# Patient Record
Sex: Female | Born: 1985 | Race: Black or African American | Hispanic: No | Marital: Married | Smoking: Never smoker
Health system: Southern US, Community
[De-identification: ages and names within clinical notes are randomized; demographics above are authoritative.]

## PROBLEM LIST (undated history)

## (undated) DIAGNOSIS — Z789 Other specified health status: Secondary | ICD-10-CM

## (undated) HISTORY — PX: NO PAST SURGERIES: SHX2092

---

## 2015-02-23 ENCOUNTER — Other Ambulatory Visit (HOSPITAL_COMMUNITY)
Admission: RE | Admit: 2015-02-23 | Discharge: 2015-02-23 | Disposition: A | Discharge status: Home / Self Care | Payer: 59 | Source: Ambulatory Visit | Attending: Obstetrics & Gynecology | Admitting: Obstetrics & Gynecology

## 2015-02-23 ENCOUNTER — Other Ambulatory Visit: Payer: Self-pay | Admitting: Obstetrics & Gynecology

## 2015-02-23 DIAGNOSIS — Z01411 Encounter for gynecological examination (general) (routine) with abnormal findings: Secondary | ICD-10-CM | POA: Diagnosis not present

## 2015-02-23 DIAGNOSIS — Z1151 Encounter for screening for human papillomavirus (HPV): Secondary | ICD-10-CM | POA: Diagnosis not present

## 2015-02-23 DIAGNOSIS — Z8742 Personal history of other diseases of the female genital tract: Secondary | ICD-10-CM | POA: Diagnosis not present

## 2015-02-23 DIAGNOSIS — Z01419 Encounter for gynecological examination (general) (routine) without abnormal findings: Secondary | ICD-10-CM | POA: Insufficient documentation

## 2015-02-23 DIAGNOSIS — Z3009 Encounter for other general counseling and advice on contraception: Secondary | ICD-10-CM | POA: Diagnosis not present

## 2015-02-23 DIAGNOSIS — Z3046 Encounter for surveillance of implantable subdermal contraceptive: Secondary | ICD-10-CM | POA: Diagnosis not present

## 2015-02-27 LAB — CYTOLOGY - PAP

## 2015-06-29 DIAGNOSIS — Z3400 Encounter for supervision of normal first pregnancy, unspecified trimester: Secondary | ICD-10-CM | POA: Diagnosis not present

## 2015-07-13 DIAGNOSIS — Z3402 Encounter for supervision of normal first pregnancy, second trimester: Secondary | ICD-10-CM | POA: Diagnosis not present

## 2015-08-24 DIAGNOSIS — N760 Acute vaginitis: Secondary | ICD-10-CM | POA: Diagnosis not present

## 2015-08-24 DIAGNOSIS — Z3402 Encounter for supervision of normal first pregnancy, second trimester: Secondary | ICD-10-CM | POA: Diagnosis not present

## 2015-08-24 MED FILL — TERCONAZOLE 0.4% VAG CREAM: 0.4 | 7 days supply | Qty: 45 | Fill #0

## 2015-09-06 DIAGNOSIS — Z36 Encounter for antenatal screening of mother: Secondary | ICD-10-CM | POA: Diagnosis not present

## 2015-09-06 DIAGNOSIS — Z3A20 20 weeks gestation of pregnancy: Secondary | ICD-10-CM | POA: Diagnosis not present

## 2015-09-06 DIAGNOSIS — Z3402 Encounter for supervision of normal first pregnancy, second trimester: Secondary | ICD-10-CM | POA: Diagnosis not present

## 2015-09-07 ENCOUNTER — Other Ambulatory Visit (HOSPITAL_COMMUNITY): Payer: Self-pay | Admitting: Obstetrics & Gynecology

## 2015-09-07 DIAGNOSIS — R9389 Abnormal findings on diagnostic imaging of other specified body structures: Secondary | ICD-10-CM

## 2015-09-07 DIAGNOSIS — Z3689 Encounter for other specified antenatal screening: Secondary | ICD-10-CM

## 2015-09-07 DIAGNOSIS — Z3A21 21 weeks gestation of pregnancy: Secondary | ICD-10-CM

## 2015-09-11 ENCOUNTER — Encounter (HOSPITAL_COMMUNITY): Payer: Self-pay | Admitting: *Deleted

## 2015-09-12 ENCOUNTER — Encounter (HOSPITAL_COMMUNITY): Payer: Self-pay

## 2015-09-12 ENCOUNTER — Ambulatory Visit (HOSPITAL_COMMUNITY)
Admission: RE | Admit: 2015-09-12 | Discharge: 2015-09-12 | Disposition: A | Discharge status: Home / Self Care | Payer: 59 | Source: Ambulatory Visit | Attending: Obstetrics & Gynecology | Admitting: Obstetrics & Gynecology

## 2015-09-12 VITALS — BP 123/75 | HR 91 | Wt 180.6 lb

## 2015-09-12 DIAGNOSIS — Z36 Encounter for antenatal screening of mother: Secondary | ICD-10-CM | POA: Insufficient documentation

## 2015-09-12 DIAGNOSIS — R9389 Abnormal findings on diagnostic imaging of other specified body structures: Secondary | ICD-10-CM

## 2015-09-12 DIAGNOSIS — IMO0002 Reserved for concepts with insufficient information to code with codable children: Secondary | ICD-10-CM

## 2015-09-12 DIAGNOSIS — Z3A21 21 weeks gestation of pregnancy: Secondary | ICD-10-CM | POA: Insufficient documentation

## 2015-09-12 DIAGNOSIS — O358XX0 Maternal care for other (suspected) fetal abnormality and damage, not applicable or unspecified: Secondary | ICD-10-CM

## 2015-09-12 DIAGNOSIS — O284 Abnormal radiological finding on antenatal screening of mother: Secondary | ICD-10-CM | POA: Diagnosis not present

## 2015-09-12 DIAGNOSIS — O35BXX0 Maternal care for other (suspected) fetal abnormality and damage, fetal cardiac anomalies, not applicable or unspecified: Secondary | ICD-10-CM

## 2015-09-12 DIAGNOSIS — Z3689 Encounter for other specified antenatal screening: Secondary | ICD-10-CM

## 2015-09-12 HISTORY — DX: Other specified health status: Z78.9

## 2015-09-12 NOTE — Consult Note (Signed)
MFM consult  30 yr old G1P0 at 6176w0d with concern for fetal cardiac defect on outside ultrasound referred by Dr. Charlotta Newtonzan for fetal anatomic survey and consult.  Ultrasound today shows: single intrauterine pregnancy. Fetal biometry is consistent with dating. Posterior placenta without evidence of previa. Normal amniotic fluid volume. Normal transabdominal cervical length. The fetal heart is abnormal- the left ventricle and atrium appear extremely small; the aorta appears small. The outflow tracts and arches are not well visualized. The remainder of the fetal anatomic survey is normal.  I counseled the patient as follows: 1. Appropriate fetal growth. 2. 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 I discussed that the next step is for referral to Pediatric Cardiologists. I discussed the counseling regarding treatment and prognosis will depend on specific diagnosis determined by their echocardiogram. I discussed she would likely need [facility] depending on final diagnosis to have immediate access to the Pediatric Cardiologists. The infant would likely be admitted to the NICU for care. I discussed the neonate will likely need intervention after delivery including possible intubation and surgery.  However, the Pediatric Cardiologist will discuss specific therapies and prognosis with the patient.  I discussed the strong association with congenital heart defects and fetal aneuploidy. The patient had a low risk quad screen. The risk of aneuploidy or chromosomal defect may be as high as 40%. I discussed congenital heart defects can be seen in DiGeorge syndrome and many other genetic conditions.  I discussed the benefits/risks of amniocentesis including risks of bleeding, infection, rupture of membranes, and pregnancy loss of 1/400. After counseling patient declined amniocentesis. Also  discussed option of cell free fetal DNA and limitations in detecting fetal aneuploidy. I also offered the patient a consultation with our genetic counselor which she declined at this time.  I also discussed the increased risk of associated congenital anomalies; although the remainder of the anatomy survey is normal today. I discussed the limitations of ultrasound in detecting anomalies.  I recommend the patient follow up in 4 weeks for a fetal ultrasound. I recommend serial fetal growth ultrasounds.  Results given to Dr. Lawana Chamberszan's office.  I spent a total of 45 minutes with the patient of which >50% was in face to face consultation.  Monica FosterKristen Makel Mcmann, MD

## 2015-09-13 DIAGNOSIS — O358XX0 Maternal care for other (suspected) fetal abnormality and damage, not applicable or unspecified: Secondary | ICD-10-CM | POA: Diagnosis not present

## 2015-09-13 DIAGNOSIS — Q234 Hypoplastic left heart syndrome: Secondary | ICD-10-CM | POA: Diagnosis not present

## 2015-09-13 DIAGNOSIS — Z3A21 21 weeks gestation of pregnancy: Secondary | ICD-10-CM | POA: Diagnosis not present

## 2015-09-18 ENCOUNTER — Other Ambulatory Visit (HOSPITAL_COMMUNITY): Payer: Self-pay

## 2015-09-18 ENCOUNTER — Ambulatory Visit (HOSPITAL_COMMUNITY): Payer: 59

## 2015-09-18 ENCOUNTER — Encounter (HOSPITAL_COMMUNITY): Payer: Self-pay

## 2015-10-06 MED FILL — TERCONAZOLE 0.4% VAG CREAM: 0.4 | 7 days supply | Qty: 45 | Fill #0

## 2015-10-10 ENCOUNTER — Ambulatory Visit (HOSPITAL_COMMUNITY): Payer: 59

## 2015-10-11 ENCOUNTER — Encounter (HOSPITAL_COMMUNITY): Payer: Self-pay

## 2015-10-11 ENCOUNTER — Ambulatory Visit (HOSPITAL_COMMUNITY): Payer: 59

## 2015-10-11 DIAGNOSIS — O358XX0 Maternal care for other (suspected) fetal abnormality and damage, not applicable or unspecified: Secondary | ICD-10-CM | POA: Diagnosis not present

## 2015-10-11 DIAGNOSIS — Q234 Hypoplastic left heart syndrome: Secondary | ICD-10-CM | POA: Diagnosis not present

## 2015-10-11 DIAGNOSIS — Q2542 Hypoplasia of aorta: Secondary | ICD-10-CM | POA: Diagnosis not present

## 2015-10-11 DIAGNOSIS — Z3A25 25 weeks gestation of pregnancy: Secondary | ICD-10-CM | POA: Diagnosis not present

## 2015-10-18 DIAGNOSIS — O0992 Supervision of high risk pregnancy, unspecified, second trimester: Secondary | ICD-10-CM | POA: Diagnosis not present

## 2015-10-18 DIAGNOSIS — O359XX0 Maternal care for (suspected) fetal abnormality and damage, unspecified, not applicable or unspecified: Secondary | ICD-10-CM | POA: Diagnosis not present

## 2015-11-15 DIAGNOSIS — Q234 Hypoplastic left heart syndrome: Secondary | ICD-10-CM | POA: Diagnosis not present

## 2015-11-15 DIAGNOSIS — O358XX0 Maternal care for other (suspected) fetal abnormality and damage, not applicable or unspecified: Secondary | ICD-10-CM | POA: Diagnosis not present

## 2015-11-15 DIAGNOSIS — Z3A30 30 weeks gestation of pregnancy: Secondary | ICD-10-CM | POA: Diagnosis not present

## 2015-11-15 DIAGNOSIS — Z79899 Other long term (current) drug therapy: Secondary | ICD-10-CM | POA: Diagnosis not present

## 2015-11-17 DIAGNOSIS — O359XX0 Maternal care for (suspected) fetal abnormality and damage, unspecified, not applicable or unspecified: Secondary | ICD-10-CM | POA: Diagnosis not present

## 2015-11-17 DIAGNOSIS — O0993 Supervision of high risk pregnancy, unspecified, third trimester: Secondary | ICD-10-CM | POA: Diagnosis not present

## 2015-11-17 DIAGNOSIS — Z23 Encounter for immunization: Secondary | ICD-10-CM | POA: Diagnosis not present

## 2015-11-21 DIAGNOSIS — Z3A31 31 weeks gestation of pregnancy: Secondary | ICD-10-CM | POA: Diagnosis not present

## 2015-11-21 DIAGNOSIS — O359XX0 Maternal care for (suspected) fetal abnormality and damage, unspecified, not applicable or unspecified: Secondary | ICD-10-CM | POA: Diagnosis not present

## 2015-11-28 DIAGNOSIS — Q234 Hypoplastic left heart syndrome: Secondary | ICD-10-CM | POA: Diagnosis not present

## 2015-11-28 DIAGNOSIS — O09513 Supervision of elderly primigravida, third trimester: Secondary | ICD-10-CM | POA: Diagnosis not present

## 2015-11-28 DIAGNOSIS — O358XX0 Maternal care for other (suspected) fetal abnormality and damage, not applicable or unspecified: Secondary | ICD-10-CM | POA: Diagnosis not present

## 2015-11-28 DIAGNOSIS — O358XX1 Maternal care for other (suspected) fetal abnormality and damage, fetus 1: Secondary | ICD-10-CM | POA: Diagnosis not present

## 2015-11-28 DIAGNOSIS — Z3A32 32 weeks gestation of pregnancy: Secondary | ICD-10-CM | POA: Diagnosis not present

## 2015-11-28 DIAGNOSIS — O0993 Supervision of high risk pregnancy, unspecified, third trimester: Secondary | ICD-10-CM | POA: Diagnosis not present

## 2015-11-29 DIAGNOSIS — O0993 Supervision of high risk pregnancy, unspecified, third trimester: Secondary | ICD-10-CM | POA: Diagnosis not present

## 2015-11-29 DIAGNOSIS — Z23 Encounter for immunization: Secondary | ICD-10-CM | POA: Diagnosis not present

## 2015-12-13 DIAGNOSIS — N760 Acute vaginitis: Secondary | ICD-10-CM | POA: Diagnosis not present

## 2015-12-13 DIAGNOSIS — O0993 Supervision of high risk pregnancy, unspecified, third trimester: Secondary | ICD-10-CM | POA: Diagnosis not present

## 2015-12-13 DIAGNOSIS — O359XX0 Maternal care for (suspected) fetal abnormality and damage, unspecified, not applicable or unspecified: Secondary | ICD-10-CM | POA: Diagnosis not present

## 2015-12-19 DIAGNOSIS — O359XX0 Maternal care for (suspected) fetal abnormality and damage, unspecified, not applicable or unspecified: Secondary | ICD-10-CM | POA: Diagnosis not present

## 2015-12-19 DIAGNOSIS — Z3A35 35 weeks gestation of pregnancy: Secondary | ICD-10-CM | POA: Diagnosis not present

## 2015-12-19 MED FILL — TERCONAZOLE 0.4% VAG CREAM: 0.4 | 7 days supply | Qty: 45 | Fill #1

## 2015-12-27 DIAGNOSIS — O0993 Supervision of high risk pregnancy, unspecified, third trimester: Secondary | ICD-10-CM | POA: Diagnosis not present

## 2015-12-27 DIAGNOSIS — O358XX0 Maternal care for other (suspected) fetal abnormality and damage, not applicable or unspecified: Secondary | ICD-10-CM | POA: Diagnosis not present

## 2015-12-27 DIAGNOSIS — Z3A36 36 weeks gestation of pregnancy: Secondary | ICD-10-CM | POA: Diagnosis not present

## 2016-01-11 DIAGNOSIS — O359XX0 Maternal care for (suspected) fetal abnormality and damage, unspecified, not applicable or unspecified: Secondary | ICD-10-CM | POA: Diagnosis not present

## 2016-01-11 DIAGNOSIS — O0993 Supervision of high risk pregnancy, unspecified, third trimester: Secondary | ICD-10-CM | POA: Diagnosis not present

## 2016-01-11 DIAGNOSIS — O26843 Uterine size-date discrepancy, third trimester: Secondary | ICD-10-CM | POA: Diagnosis not present

## 2016-01-16 DIAGNOSIS — Z3A00 Weeks of gestation of pregnancy not specified: Secondary | ICD-10-CM | POA: Diagnosis not present

## 2016-01-16 DIAGNOSIS — O358XX0 Maternal care for other (suspected) fetal abnormality and damage, not applicable or unspecified: Secondary | ICD-10-CM | POA: Diagnosis not present

## 2016-01-16 DIAGNOSIS — Z8742 Personal history of other diseases of the female genital tract: Secondary | ICD-10-CM | POA: Diagnosis not present

## 2016-01-16 DIAGNOSIS — Z3A39 39 weeks gestation of pregnancy: Secondary | ICD-10-CM | POA: Diagnosis not present

## 2016-07-16 ENCOUNTER — Encounter (HOSPITAL_COMMUNITY): Payer: Self-pay

## 2016-07-24 DIAGNOSIS — Z3009 Encounter for other general counseling and advice on contraception: Secondary | ICD-10-CM | POA: Diagnosis not present

## 2016-08-06 DIAGNOSIS — Z3202 Encounter for pregnancy test, result negative: Secondary | ICD-10-CM | POA: Diagnosis not present

## 2016-08-06 DIAGNOSIS — Z30430 Encounter for insertion of intrauterine contraceptive device: Secondary | ICD-10-CM | POA: Diagnosis not present

## 2016-09-17 DIAGNOSIS — N949 Unspecified condition associated with female genital organs and menstrual cycle: Secondary | ICD-10-CM | POA: Diagnosis not present

## 2016-09-17 DIAGNOSIS — N941 Unspecified dyspareunia: Secondary | ICD-10-CM | POA: Diagnosis not present

## 2016-09-17 DIAGNOSIS — Z30431 Encounter for routine checking of intrauterine contraceptive device: Secondary | ICD-10-CM | POA: Diagnosis not present

## 2016-09-26 ENCOUNTER — Encounter: Payer: Self-pay | Admitting: Physical Therapy

## 2016-09-26 ENCOUNTER — Ambulatory Visit: Payer: 59 | Attending: Obstetrics & Gynecology | Admitting: Physical Therapy

## 2016-09-26 DIAGNOSIS — R252 Cramp and spasm: Secondary | ICD-10-CM | POA: Diagnosis not present

## 2016-09-26 DIAGNOSIS — M6281 Muscle weakness (generalized): Secondary | ICD-10-CM | POA: Diagnosis not present

## 2016-09-26 NOTE — Therapy (Signed)
[facility] [address], [address] [place], [place], [address] Phone: [phone]   Fax:  [phone]  Physical Therapy Evaluation  Patient Details  Name: Monica Norton MRN: [id number] Date of Birth: Oct 17, 1985 Referring Provider: Dr. Myna Hidalgo  Encounter Date: 09/26/2016      PT End of Session - 09/26/16 1232    Visit Number 1   Date for PT Re-Evaluation 01/26/17   PT Start Time 1200  came late   PT Stop Time 1230   PT Time Calculation (min) 30 min   Activity Tolerance Treatment limited secondary to medical complications (Comment)   Behavior During Therapy [facility] for tasks assessed/performed      Past Medical History:  Diagnosis Date  . Medical history non-contributory     Past Surgical History:  Procedure Laterality Date  . NO PAST SURGERIES      There were no vitals filed for this visit.       Subjective Assessment - 09/26/16 1159    Subjective Patient reports pain after having a baby and pain with intercourse.  Patient reports pain at perineal area.  Has to have lubrication.    Patient Stated Goals reduce pain with intercourse   Currently in Pain? Yes   Pain Score 6    Pain Location Vagina   Pain Orientation Mid   Pain Descriptors / Indicators Shooting;Sharp   Pain Type Acute pain   Pain Onset More than a month ago   Pain Frequency Intermittent   Aggravating Factors  intercourse and after intercourse   Pain Relieving Factors no intercourse   Multiple Pain Sites No            [facility] PT Assessment - 09/26/16 0001      Assessment   Medical Diagnosis N94.10 dyspareunia in female; N94.9 Perineal pain in female   Referring Provider Dr. Myna Hidalgo   Onset Date/Surgical Date 02/29/16   Prior Therapy none     Precautions   Precautions None     Restrictions   Weight Bearing Restrictions No     Balance Screen   Has the patient fallen in the past 6 months No   Has the patient had a decrease  in activity level because of a fear of falling?  No   Is the patient reluctant to leave their home because of a fear of falling?  No     Home [employer] information [employer] residence     Prior Function   Level of Independence Independent     Cognition   Overall Cognitive Status Within Functional Limits for tasks assessed     Observation/Other Assessments   Focus on Therapeutic Outcomes (FOTO)  8% limitation for FOTO     Posture/Postural Control   Posture/Postural Control No significant limitations     ROM / Strength   AROM / PROM / Strength AROM;PROM;Strength     AROM   Overall AROM Comments full lumbar ROM     Strength   Overall Strength Comments abdominal strength 1/5   Right Hip ABduction 3+/5   Left Hip ABduction 3+/5     Palpation   SI assessment  pelvis in correct alignment     Transfers   Transfers Not assessed     Ambulation/Gait   Ambulation/Gait No            Objective measurements completed on examination: See above findings.        Pelvic Floor Special Questions - 09/26/16 0001  Diastasis Recti none   Currently Sexually Active Yes   Is this Painful Yes  pain afterwards for several days   Marinoff Scale discomfort that does not affect completion   Urinary Leakage Yes   Activities that cause leaking Other  after urinating   Exam Type Deferred  on her cycle                  PT Education - 09/26/16 1231    Education provided Yes   Education Details how to massage the perineaum, stretches, gave samples of lubricants, abdominal contraction   Person(s) Educated Patient   Methods Explanation;Demonstration;Verbal cues;Handout   Comprehension Returned demonstration;Verbalized understanding          PT Short Term Goals - 09/26/16 1251      PT SHORT TERM GOAL #1   Title pelvic floor is assessed due to  not on her cycle   Time 4   Period Weeks   Status New   Target Date 10/24/16     PT SHORT TERM GOAL #2    Title understand how to perfrom perineal massage to reduce pain with intercourse   Time 4   Period Weeks   Status New   Target Date 10/24/16     PT SHORT TERM GOAL #3   Title ability to contract her abdoment correctly to engage her core with activities   Time 4   Period Weeks   Status New   Target Date 10/24/16     PT SHORT TERM GOAL #4   Title pain with intercoruse decreased >/= 25%   Time 4   Period Weeks   Status New   Target Date 10/24/16           PT Long Term Goals - 09/26/16 1325      PT LONG TERM GOAL #1   Title independent with HEP and understands how to progress herself   Time 4   Period Months   Status New   Target Date 01/26/17     PT LONG TERM GOAL #2   Title abdominal strength >/= 3/5 so she is able to lift her child with decreased strain on the pelvic floor.    Time 4   Period Months   Target Date 01/26/17     PT LONG TERM GOAL #3   Title pain with intercourse decreased >/= 75% due to improved mobility of the perineal body.   Time 4   Period Months   Status New   Target Date 01/26/17     PT LONG TERM GOAL #4   Title ability to exercise correctly at the gym or with a video tape with decreased strain on the pelvic floor   Time 4   Period Months   Status New   Target Date 01/26/17                Plan - 09/26/16 1233    Clinical Impression Statement Patient is a 31 year old female with perineal pain when having intercourse since she had her daughter 8 months ago.  Patient reports intermittent pain at level 6/10 in the perineal area.  Patient reports pain will lasat for several days.  Marinoff score is 1/3. Patient abdominal strength is 1/5 with difficulty engaging the lower abdominals.  Tightness in the lateral abdominal wall. Bilateral hip abduction and extension strength is weak. Patient will benefit from skilled therapy to improve pain in the perineal area and improve overall strength.    History  and Personal Factors relevant to plan of  care: none   Clinical Presentation Stable   Clinical Presentation due to: stable condition   Clinical Decision Making Low   Rehab Potential Excellent   Clinical Impairments Affecting Rehab Potential none   PT Frequency 2x / week   PT Duration Other (comment)  4 months   PT Treatment/Interventions Biofeedback;Electrical Stimulation;Moist Heat;Ultrasound;Therapeutic activities;Therapeutic exercise;Neuromuscular re-education;Patient/family education;Passive range of motion;Scar mobilization;Manual techniques;Dry needling   PT Next Visit Plan assess the pelvic floor further; core strength, hip abduction strength   PT Home Exercise Plan progress as needed   Consulted and Agree with Plan of Care Patient      Patient will benefit from skilled therapeutic intervention in order to improve the following deficits and impairments:  Pain, Decreased strength, Decreased mobility, Decreased scar mobility, Decreased activity tolerance, Increased muscle spasms, Increased fascial restricitons  Visit Diagnosis: Muscle weakness (generalized) - Plan: PT plan of care cert/re-cert  Cramp and spasm - Plan: PT plan of care cert/re-cert     Problem List There are no active problems to display for this patient.   Eulis Foster, PT 09/26/16 1:29 PM   [facility] [address], [address] [place], [place], [address] Phone: [phone]   Fax:  [phone]  Name: Monica Norton MRN: [id number] Date of Birth: 06-02-1985

## 2016-09-26 NOTE — Patient Instructions (Addendum)
Isometric Hold (Hook-Lying)    Lie with hips and knees bent. Slowly inhale, and then exhale. Pull navel toward spine and Hold for _5__ seconds. Continue to breathe in and out during hold. Rest for _5_ seconds. Repeat _10__ times. Do _2__ times a day.   Copyright  VHI. All rights reserved.    Bracing With Knee Fallout (Hook-Lying)    With neutral spine, tighten pelvic floor and abdominals and hold. Alternating legs, drop knee out to side. Keep opposite hip still. Repeat _10__ times. Do _2__ times a day.   Copyright  VHI. All rights reserved.  STRETCHING THE PELVIC FLOOR MUSCLES NO DILATOR  Supplies . Vaginal lubricant . Mirror (optional) . Gloves (optional) Positioning . Start in a semi-reclined position with your head propped up. Bend your knees and place your thumb or finger at the vaginal opening. Procedure . Apply a moderate amount of lubricant on the outer skin of your vagina, the labia minora.  Apply additional lubricant to your finger. Marland Kitchen. Spread the skin away from the vaginal opening. Place the end of your finger at the opening. . Do a maximum contraction of the pelvic floor muscles. Tighten the vagina and the anus maximally and relax. . When you know they are relaxed, gently and slowly insert your finger into your vagina, directing your finger slightly downward, for 2-3 inches of insertion. . Relax and stretch the 6 o'clock position . Hold each stretch for _2 min__ and repeat __1_ time with rest breaks of _1__ seconds between each stretch. . Repeat the stretching in the 4 o'clock and 8 o'clock positions. . Total time should be _6__ minutes, _1__ x per day.  Note the amount of theme your were able to achieve and your tolerance to your finger in your vagina. . Once you have accomplished the techniques you may try them in standing with one foot resting on the tub, or in other positions.  This is a good stretch to do in the shower if you don't need to use lubricant.    Cat /  Cow Flow    Inhale, press spine toward ceiling like a Halloween cat. Keeping strength in arms and abdominals, exhale to soften spine through neutral and into cow pose. Open chest and arch back. Initiate movement between cat and cow at tailbone, one vertebrae at a time. Repeat _15___ times.  Copyright  VHI. All rights reserved.  BACK: Child's Pose (Sciatica)    Sit in knee-chest position and reach arms forward. Separate knees for comfort. Hold position for _30__ breaths. Repeat _2__ times. Do _1__ times per day. Then go to one side and hold 30 seconds then the other side hold 30 seconds. Copyright  VHI. All rights reserved.   [facility] [address], [address] [place], [place] [address] Phone # [phone] Fax [phone]

## 2016-10-10 ENCOUNTER — Ambulatory Visit: Payer: 59 | Attending: Obstetrics & Gynecology | Admitting: Physical Therapy

## 2016-10-10 ENCOUNTER — Encounter: Payer: Self-pay | Admitting: Physical Therapy

## 2016-10-10 DIAGNOSIS — M6281 Muscle weakness (generalized): Secondary | ICD-10-CM | POA: Diagnosis not present

## 2016-10-10 DIAGNOSIS — R252 Cramp and spasm: Secondary | ICD-10-CM | POA: Insufficient documentation

## 2016-10-10 NOTE — Therapy (Signed)
[facility] [address], [address] [place], [place], [address] Phone: [phone]   Fax:  [phone]  Physical Therapy Treatment  Patient Details  Name: Monica Norton MRN: [id number] Date of Birth: 28-Aug-1985 Referring Provider: Dr. Myna Hidalgo  Encounter Date: 10/10/2016      PT End of Session - 10/10/16 1306    Visit Number 2   Date for PT Re-Evaluation 01/26/17   PT Start Time 1145   PT Stop Time 1250   PT Time Calculation (min) 65 min   Activity Tolerance Patient tolerated treatment well   Behavior During Therapy [facility] for tasks assessed/performed      Past Medical History:  Diagnosis Date  . Medical history non-contributory     Past Surgical History:  Procedure Laterality Date  . NO PAST SURGERIES      There were no vitals filed for this visit.      Subjective Assessment - 10/10/16 1149    Subjective I first 2 days I did my exercises. I lost my exercises. I have been massaging the pelvic floor and less pain with intercourse.    Patient Stated Goals reduce pain with intercourse   Currently in Pain? Yes   Pain Score 6    Pain Location Vagina   Pain Orientation Mid   Pain Descriptors / Indicators Shooting;Sharp   Pain Type Acute pain   Pain Onset More than a month ago   Pain Frequency Intermittent   Aggravating Factors  intercourse and after intercourse   Pain Relieving Factors no intercourse   Multiple Pain Sites No                         [facility] Adult PT Treatment/Exercise - 10/10/16 0001      Lumbar Exercises: Stretches   Quadruped Mid Back Stretch 5 reps;30 seconds  front way, bil. sides   Quadruped Mid Back Stretch Limitations therapist will assist in stretch in the lateral trunk and increasing hip flexion     Lumbar Exercises: Supine   Ab Set 20 reps;5 seconds;Limitations   AB Set Limitations tactile cues to contact the lower abdomen and bring rib cage downward.      Manual  Therapy   Manual Therapy Joint mobilization;Soft tissue mobilization;Myofascial release   Manual therapy comments bil. lumbar and thoracic paraspinals with assistive device to improve the fascial mobility   Joint Mobilization bilateral lower rib cage to improve expansion   Soft tissue mobilization bil. lumbar paraspinals; obliques; transvers abdominus; diaphgram;    Myofascial Release thoracolumbar fascia; lateral abdominal fascia; release around the umbilicus                PT Education - 10/10/16 1306    Education provided Yes   Education Details stretches; abdominal breathing; abdominal contraction   Person(s) Educated Patient   Methods Explanation;Demonstration;Verbal cues;Handout   Comprehension Returned demonstration;Verbalized understanding          PT Short Term Goals - 10/10/16 1401      PT SHORT TERM GOAL #1   Title pelvic floor is assessed due to  not on her cycle   Time 4   Period Weeks   Status New     PT SHORT TERM GOAL #2   Title understand how to perfrom perineal massage to reduce pain with intercourse   Time 4   Period Weeks   Status Achieved     PT SHORT TERM GOAL #3   Title  ability to contract her abdoment correctly to engage her core with activities   Time 4   Period Weeks   Status Achieved     PT SHORT TERM GOAL #4   Title pain with intercoruse decreased >/= 25%   Time 4   Period Weeks   Status On-going           PT Long Term Goals - 09/26/16 1325      PT LONG TERM GOAL #1   Title independent with HEP and understands how to progress herself   Time 4   Period Months   Status New   Target Date 01/26/17     PT LONG TERM GOAL #2   Title abdominal strength >/= 3/5 so she is able to lift her child with decreased strain on the pelvic floor.    Time 4   Period Months   Target Date 01/26/17     PT LONG TERM GOAL #3   Title pain with intercourse decreased >/= 75% due to improved mobility of the perineal body.   Time 4   Period  Months   Status New   Target Date 01/26/17     PT LONG TERM GOAL #4   Title ability to exercise correctly at the gym or with a video tape with decreased strain on the pelvic floor   Time 4   Period Months   Status New   Target Date 01/26/17               Plan - 10/10/16 1358    Clinical Impression Statement Patient was able to have intercourse 3 days in a row but had pain in the third day. Patient had reduction in abdominal swelling after therapy.  Patient was able to contract her abdominals correctly with breathing and no pain for first time.  Patient has increased tightness in the fascia of the back that needs soft tissue work.  Patient  will benefit from skilled therapy to reduce perineal pain, improve mobility of back and pelvic floor tissue to reduce pain.    Rehab Potential Excellent   Clinical Impairments Affecting Rehab Potential none   PT Frequency 2x / week   PT Duration Other (comment)  4 months   PT Treatment/Interventions Biofeedback;Electrical Stimulation;Moist Heat;Ultrasound;Therapeutic activities;Therapeutic exercise;Neuromuscular re-education;Patient/family education;Passive range of motion;Scar mobilization;Manual techniques;Dry needling   PT Next Visit Plan assess the pelvic floor further; core strength, hip abduction strength   PT Home Exercise Plan progress as needed   Recommended Other Services md signed initial note   Consulted and Agree with Plan of Care Patient      Patient will benefit from skilled therapeutic intervention in order to improve the following deficits and impairments:  Pain, Decreased strength, Decreased mobility, Decreased scar mobility, Decreased activity tolerance, Increased muscle spasms, Increased fascial restricitons  Visit Diagnosis: Muscle weakness (generalized)  Cramp and spasm     Problem List There are no active problems to display for this patient.   Eulis FosterCheryl Gray, PT 10/10/16 2:02 PM   [facility] [address], [address] [place], [place], [address] Phone: [phone]   Fax:  [phone]  Name: Monica ScalesShayla Norton MRN: [id number] Date of Birth: [birth year]

## 2016-10-10 NOTE — Patient Instructions (Addendum)
Cat / Cow Flow    Inhale, press spine toward ceiling like a Halloween cat. Keeping strength in arms and abdominals, exhale to soften spine through neutral and into cow pose. Open chest and arch back. Initiate movement between cat and cow at tailbone, one vertebrae at a time. Repeat __20__ times.  Copyright  VHI. All rights reserved.  Upper Back Flexibility: Eagle Pose Arms    Wrap elbows, forearms, wrists and hands. Hold for __15__ breaths. Repeat __1__ times.  Copyright  VHI. All rights reserved.  Mid-Back Stretch    Push chest toward floor, reaching forward as far as possible. Hold _30___ seconds. Repeat ___2_ times per set. Do __1__ sets per session. Do __1__ sessions per day.  http://orth.exer.us/130   Copyright  VHI. All rights reserved.  Mid-Back Rotation Stretch   30 Reach to each side as far as possible, keeping chest low to floor. Hold ____ seconds. Repeat __2__ times per set. Do _1___ sets per session. Do _1___ sessions per day.  http://orth.exer.us/132   Copyright  VHI. All rights reserved.  Quads / HF, Side-Lying    Lie on one side, legs bent. Hold foot of top leg with same-side hand. Raise leg. Hold _30__ seconds.  Repeat _2__ times per session. Do _1__ sessions per day.  Copyright  VHI. All rights reserved.  Adductors, Sitting With Hip Flexion    Sit with legs open in a wide V, toes pointing up, hands on knees. Keep spine straight supporting trunk with arms. Slide arms down leg as trunk tips forward. Press knees apart. Hold _30__ seconds. Repeat _2__ times per session. Do __1_ sessions per day. Then bring arms to side hold 30 sec and go other way 30 seconds.  Copyright  VHI. All rights reserved.    Start in a supine position, one leg bent with foot flat on the bed/floor, and the other leg crossed over the knee.  Gently push the knee of the crossed leg forward until a strong yet pain-free stretch is felt.  Hold for 30 seconds then release.   Repeat as many sets as instructed, then switch sides and repeat.  Isometric Hold (Hook-Lying)    Lie with hips and knees bent. Slowly inhale, and then exhale. Pull navel toward spine and Hold for _5__ seconds. Continue to breathe in and out during hold. Rest for _5__ seconds. Repeat _10__ times. Do _3__ times a day. Also practice in sitting.    Copyright  VHI. All rights reserved.  Balloon Breath    Place hands LIGHTLY on belly below navel. Imagine a balloon inside belly. Blow up balloon on breath IN expanding the lower rib cage, deflate balloon on breath OUT. Contract abdominals slightly to assist breath OUT.do in sitting.   Copyright  VHI. All rights reserved.  [facility] [address], [address] [place], [place] [address] Phone # [phone] Fax [phone]

## 2016-11-06 ENCOUNTER — Encounter: Payer: Self-pay | Admitting: Physical Therapy

## 2016-11-06 ENCOUNTER — Ambulatory Visit: Payer: 59 | Attending: Obstetrics & Gynecology | Admitting: Physical Therapy

## 2016-11-06 DIAGNOSIS — R252 Cramp and spasm: Secondary | ICD-10-CM | POA: Diagnosis not present

## 2016-11-06 DIAGNOSIS — M6281 Muscle weakness (generalized): Secondary | ICD-10-CM | POA: Diagnosis not present

## 2016-11-06 NOTE — Therapy (Addendum)
[facility] [address] [place], [place], [address] Phone: [phone]   Fax:  [phone]  Physical Therapy Treatment  Patient Details  Name: Javanna Patin MRN: [id number] Date of Birth: 10-Jan-1986 Referring Provider: Dr. Janyth Pupa  Encounter Date: 11/06/2016      PT End of Session - 11/06/16 1402    Visit Number 3   Date for PT Re-Evaluation 01/26/17   PT Start Time 1400   PT Stop Time 1500   PT Time Calculation (min) 60 min   Activity Tolerance Patient tolerated treatment well   Behavior During Therapy [facility] for tasks assessed/performed      Past Medical History:  Diagnosis Date  . Medical history non-contributory     Past Surgical History:  Procedure Laterality Date  . NO PAST SURGERIES      There were no vitals filed for this visit.      Subjective Assessment - 11/06/16 1404    Subjective I have been doing great! I have lost weight and my pain is "70%" less since eval.    Currently in Pain? No/denies   Multiple Pain Sites No                         [facility] Adult PT Treatment/Exercise - 11/06/16 0001      Lumbar Exercises: Stretches   Piriformis Stretch 2 reps;20 seconds  sidelying thoracic rotation stretch bil 6x   Piriformis Stretch Limitations RT  Hip ER release in supine     Lumbar Exercises: Aerobic   Stationary Bike L2 x 10 min     Lumbar Exercises: Supine   Ab Set --  6x with ball squeeze   Bridge 5 reps;2 seconds   Bridge Limitations Verbal cues for technique   Other Supine Lumbar Exercises Oblique twist small ROM 6xbil   Other Supine Lumbar Exercises Single leg stretch Pilates 10x head down     Lumbar Exercises: Sidelying   Clam 10 reps  With TA contration, VC for neck tension; Bil   Other Sidelying Lumbar Exercises TA contraction 6x bil     Lumbar Exercises: Quadruped   Opposite Arm/Leg Raise Right arm/Left leg;Left arm/Right leg;5 reps;3 seconds                 PT Education - 11/06/16 1500    Education provided Yes   Education Details HEP advancement   Person(s) Educated Patient   Methods Explanation;Demonstration;Tactile cues;Verbal cues;Handout   Comprehension Verbalized understanding;Returned demonstration          PT Short Term Goals - 10/10/16 1401      PT SHORT TERM GOAL #1   Title pelvic floor is assessed due to  not on her cycle   Time 4   Period Weeks   Status New     PT SHORT TERM GOAL #2   Title understand how to perfrom perineal massage to reduce pain with intercourse   Time 4   Period Weeks   Status Achieved     PT SHORT TERM GOAL #3   Title ability to contract her abdoment correctly to engage her core with activities   Time 4   Period Weeks   Status Achieved     PT SHORT TERM GOAL #4   Title pain with intercoruse decreased >/= 25%   Time 4   Period Weeks   Status On-going           PT Long Term  Goals - 11/06/16 1503      PT LONG TERM GOAL #1   Title independent with HEP and understands how to progress herself   Time 4   Period Months   Status On-going     PT LONG TERM GOAL #2   Title abdominal strength >/= 3/5 so she is able to lift her child with decreased strain on the pelvic floor.    Time 4   Period Months   Status On-going     PT LONG TERM GOAL #3   Title pain with intercourse decreased >/= 75% due to improved mobility of the perineal body.   Time 4   Period Months   Status On-going  70%               Plan - 11/06/16 1402    Clinical Impression Statement Pt reports today her overall pain is 70% less since evaluation. She is compliant and independent in her HEP. Today her HEP was advanced to include further core strengthening.    Rehab Potential Excellent   Clinical Impairments Affecting Rehab Potential none   PT Frequency 2x / week   PT Treatment/Interventions Biofeedback;Electrical Stimulation;Moist Heat;Ultrasound;Therapeutic activities;Therapeutic  exercise;Neuromuscular re-education;Patient/family education;Passive range of motion;Scar mobilization;Manual techniques;Dry needling   PT Next Visit Plan assess the pelvic floor further; core strength, hip abduction strength   PT Home Exercise Plan progress as needed   Consulted and Agree with Plan of Care Patient      Patient will benefit from skilled therapeutic intervention in order to improve the following deficits and impairments:  Pain, Decreased strength, Decreased mobility, Decreased scar mobility, Decreased activity tolerance, Increased muscle spasms, Increased fascial restricitons  Visit Diagnosis: Muscle weakness (generalized)  Cramp and spasm     Problem List There are no active problems to display for this patient.   Daiya Tamer, PTA 11/06/2016, 3:05 PM  [facility] [address], [place], [place], [address] Phone: [phone]   Fax:  [phone]  Name: Arella Blinder MRN: [id number] Date of Birth: [birth year] PHYSICAL THERAPY DISCHARGE SUMMARY  Visits from Start of Care: 3  Current functional level related to goals / functional outcomes: See above.    Remaining deficits: See above.  Unable to assess patient due to her no-show for her last 2 appointments.  Patient has had inconsistent attendance.  Patient has not been assessed due to not showing for her last visit.     Education / Equipment: HEP Plan:                                                    Patient goals were not met. Patient is being discharged due to not returning since the last visit.  Thank you for the referral. Earlie Counts, PT 12/09/16 7:56 AM  ?????

## 2016-11-06 NOTE — Patient Instructions (Addendum)
Home exercise advancements:  Flexors, Supine Bridge    Lie supine, feet shoulder-width apart. Roll  hips toward ceiling. Hold 1___ seconds. Lift with your gluteal muscles!! Pull lower abs in gently as you lift. Repeat __6_ times per session. Do __1-2_ sessions per day.     Copyright  VHI. All rights reserved.  Single Leg Stretch    Lie on back, opposite hand holding knee to chest, other hand on same shin, other leg at 45. Exhale, curling up head and upper torso. Holding curl, inhale and change leg and hand positions. Exhale, changing back. Repeat ____ changes with single breaths. Repeat ____ changes in double time: 2 per inhale, 2 per exhale. NOTE: Keep navel to spine, back flat.  http://pm.exer.us/84   Copyright  VHI. All rights reserved.  Single Leg Stretch    Lie on back, start with your head DOWN on a few pilllows. This is like your bicycle, but slower. Only add the curl up when you are stronger. Exhale each exchange of your legs. Repeat __6-10__ changes with single breaths. Do 1xday NOTE: Keep navel to spine, back flat.   #3: Slow abdominal twist. Practice just a few of these as you get stronger. 3-5 each side. Exhale to lift shoulder blade off the mat slightly, gradually going higher as you get stronger. Support your head with your opposite hand.   Back Extension    Shift weight onto right knee, extend left leg behind. Keep extended leg on the floor or lift no higher than buttocks. Extend right arm forward past right ear. Hold position for _2__ breaths. Repeat on other side. Repeat _5__ times, each side. Do 1_ times per day.  Copyright  VHI. All rights reserved.    http://pm.exer.us/84   Copyright  VHI. All rights reserved.

## 2016-11-07 ENCOUNTER — Encounter: Payer: 59 | Admitting: Physical Therapy

## 2016-11-11 ENCOUNTER — Encounter: Payer: 59 | Admitting: Physical Therapy

## 2016-11-12 ENCOUNTER — Encounter: Payer: 59 | Admitting: Physical Therapy

## 2016-11-21 ENCOUNTER — Encounter: Payer: 59 | Admitting: Physical Therapy

## 2016-11-22 ENCOUNTER — Encounter: Payer: 59 | Admitting: Physical Therapy

## 2016-11-25 ENCOUNTER — Encounter: Payer: 59 | Admitting: Physical Therapy

## 2016-11-28 ENCOUNTER — Ambulatory Visit: Payer: 59 | Attending: Obstetrics & Gynecology | Admitting: Physical Therapy

## 2016-11-28 ENCOUNTER — Telehealth: Payer: Self-pay | Admitting: Physical Therapy

## 2016-11-28 NOTE — Telephone Encounter (Signed)
Spoke to patient about her 2:45 PM appointment she missed to day.  Patient reports she forgot about it due to not being in her calender. Patient was reminded of the appointment on 12/05/2016 at 2:00 PM.   Monica Norton, PT @11 /01/2016@ 3:09 PM

## 2016-12-05 ENCOUNTER — Encounter: Payer: 59 | Admitting: Physical Therapy

## 2016-12-06 ENCOUNTER — Ambulatory Visit: Payer: 59 | Admitting: Physical Therapy

## 2017-06-16 DIAGNOSIS — Z0000 Encounter for general adult medical examination without abnormal findings: Secondary | ICD-10-CM | POA: Diagnosis not present

## 2017-06-16 DIAGNOSIS — Z13220 Encounter for screening for lipoid disorders: Secondary | ICD-10-CM | POA: Diagnosis not present

## 2017-11-16 IMAGING — US US MFM OB DETAIL+14 WK
1 series · 13 of 28 positions shown · non-contrast
Comparison: none

Indications

21 weeks gestation of pregnancy
Fetal abnormality - other known or
suspected (Suspected Heart Defect)
Detailed fetal anatomic survey                 Z36
OB History
Gravidity:    1         Term:   0        Prem:   0        SAB:   0
TOP:          0       Ectopic:  0        Living: 0
Fetal Evaluation
Num Of Fetuses:     1
Cardiac Activity:   Observed
Presentation:       Cephalic
Placenta:           Posterior, above cervical os
P. Cord Insertion:  Visualized
Amniotic Fluid
AFI FV:      Subjectively within normal limits
Largest Pocket(cm)
3.4
Biometry
BPD:        51  mm     G. Age:  21w 4d         67  %    CI:        73.64   %    70 - 86
FL/HC:      19.3   %    15.9 -
HC:      188.8  mm     G. Age:  21w 1d         49  %    HC/AC:      1.17        1.06 -
AC:       161   mm     G. Age:  21w 1d         50  %    FL/BPD:     71.6   %
FL:       36.5  mm     G. Age:  21w 4d         61  %    FL/AC:      22.7   %    20 - 24
HUM:      36.1  mm     G. Age:  22w 4d         85  %
Est. FW:     418  gm    0 lb 15 oz      51  %
Gestational Age
LMP:           21w 0d        Date:  04/18/15                 EDD:   01/23/16
U/S Today:     21w 3d                                        EDD:   01/20/16
Best:          21w 0d     Det. By:  LMP  (04/18/15)          EDD:   01/23/16
Anatomy
Cranium:               Appears normal         LVOT:                   Abnormal, see
comments
Cavum:                 Appears normal         Aortic Arch:            Abnormal, see
Ventricles:            Appears normal         Ductal Arch:            Abnormal, see
Choroid Plexus:        Appears normal         Diaphragm:              Appears normal
Cerebellum:            Appears normal         Stomach:                Appears normal, left
sided
Posterior Fossa:       Appears normal         Abdomen:                Appears normal
Nuchal Fold:           Not applicable (>20    Abdominal Wall:         Appears nml (cord
wks GA)                                        insert, abd wall)
Face:                  Appears normal         Cord Vessels:           Appears normal (3
(orbits and profile)                           vessel cord)
Lips:                  Appears normal         Kidneys:                Appear normal
Palate:                Appears normal         Bladder:                Appears normal
Thoracic:              Appears normal         Spine:                  Appears normal
Heart:                 Abnormal, see          Upper Extremities:      Appears normal
RVOT:                  Abnormal, see          Lower Extremities:      Appears normal
Other:  Parents do not wish to know sex of fetus. Heels and 5th digit
visualized. Nasal bone visualized.
Cervix Uterus Adnexa
Cervix
Length:            4.9  cm.
Normal appearance by transabdominal scan.
Impression
INDICATION: 30 yr old G1P0 at 32w5d with concern for fetal
cardiac defect on outside ultrasound for fetal anatomic survey.

[Series 1: us mfm ob detail+14 wk · 92 acquisitions, 13 frames shown]
[im 4/92]
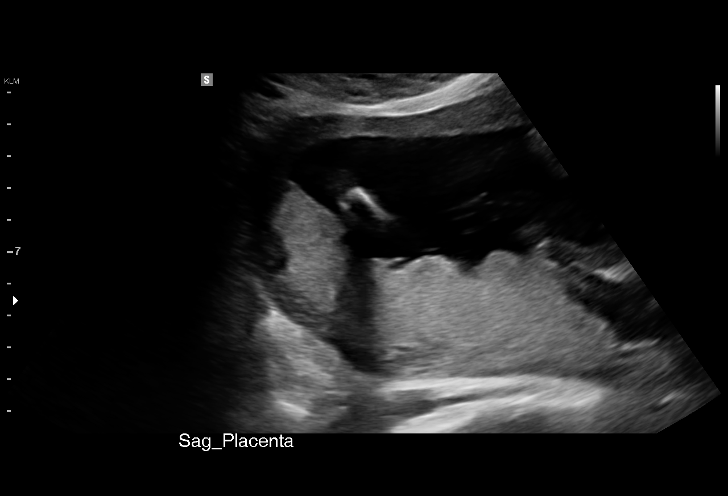
[im 11/92]
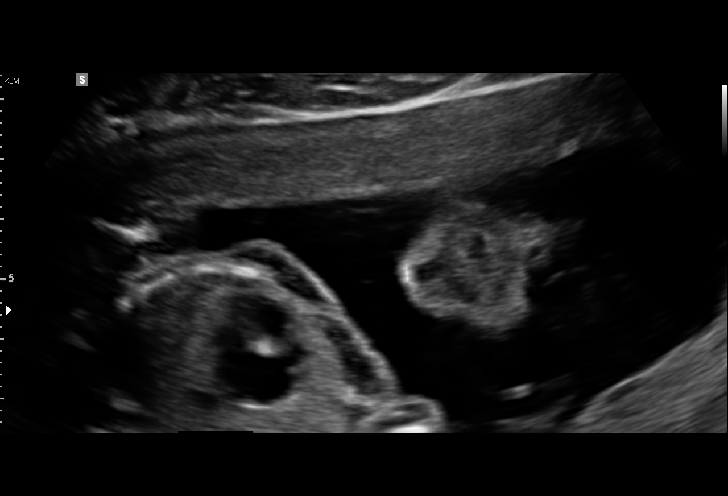
[im 17/92]
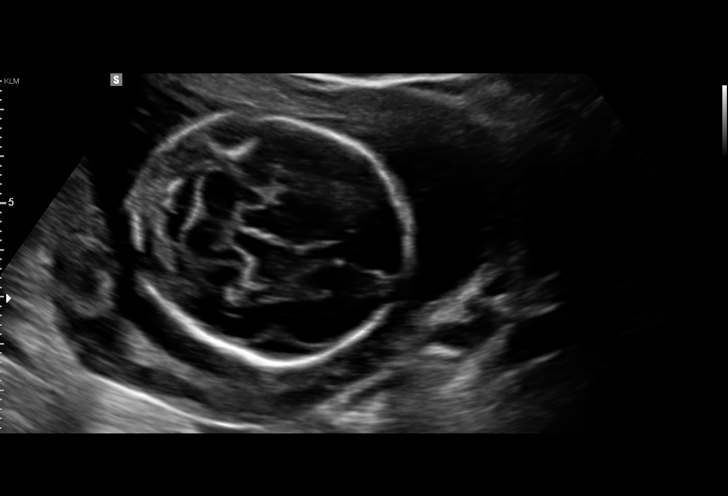
[im 24/92]
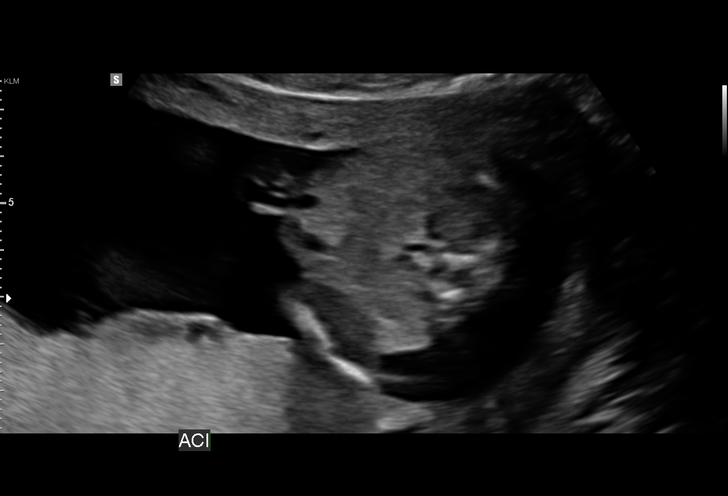
[im 31/92]
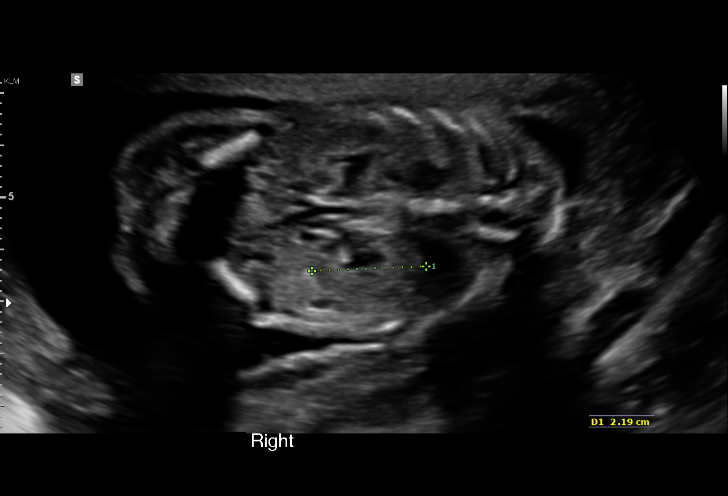
[im 38/92]
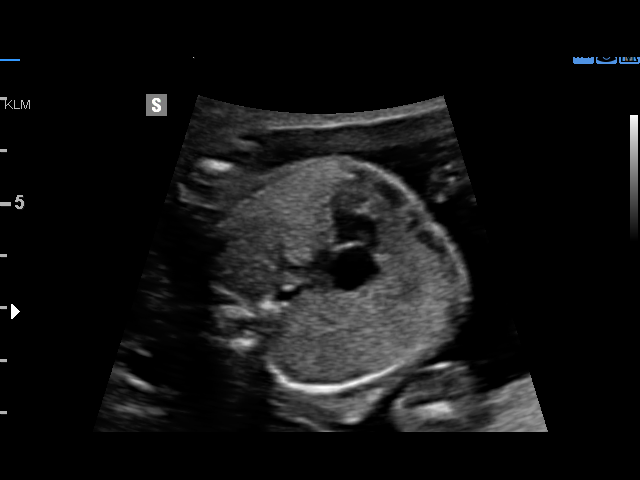
[im 48/92]
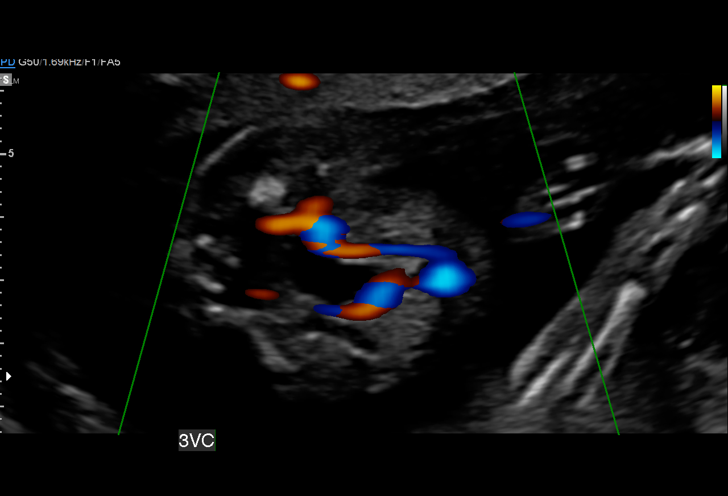
[im 54/92]
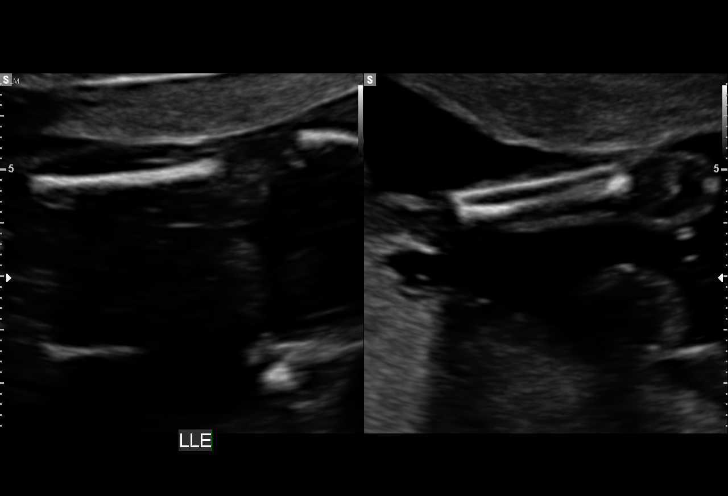
[im 61/92]
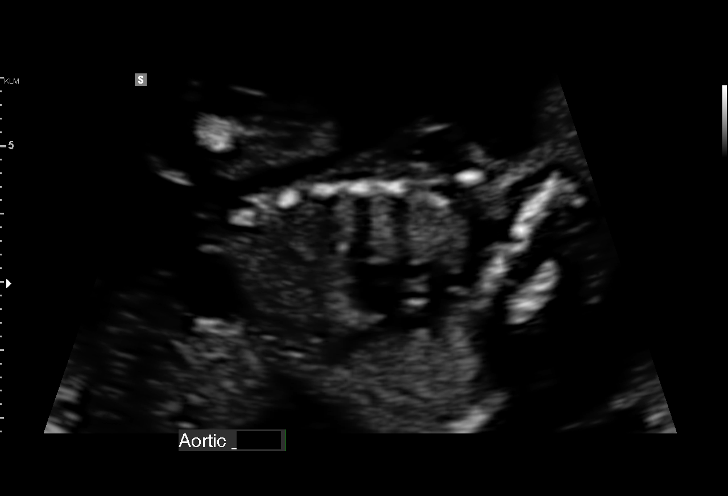
[im 68/92]
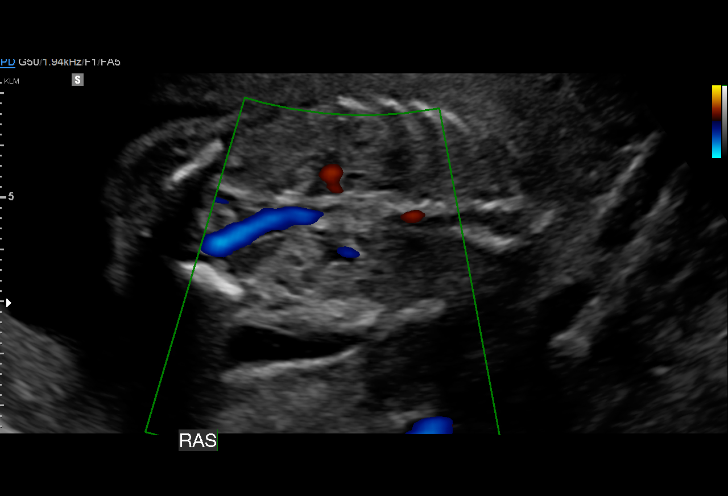
[im 75/92]
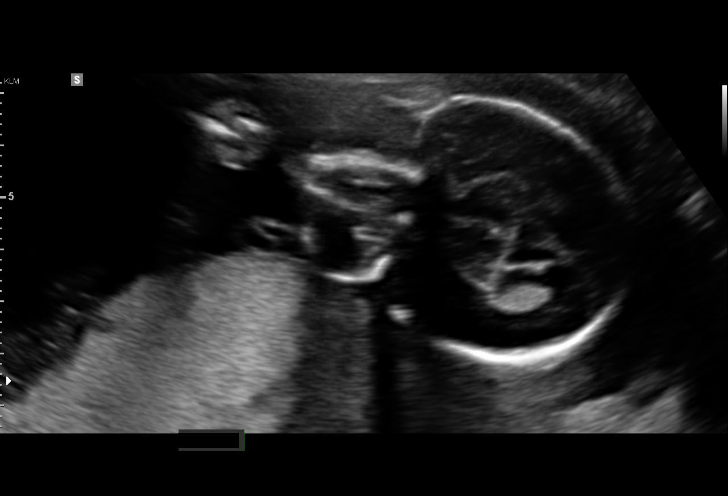
[im 81/92]
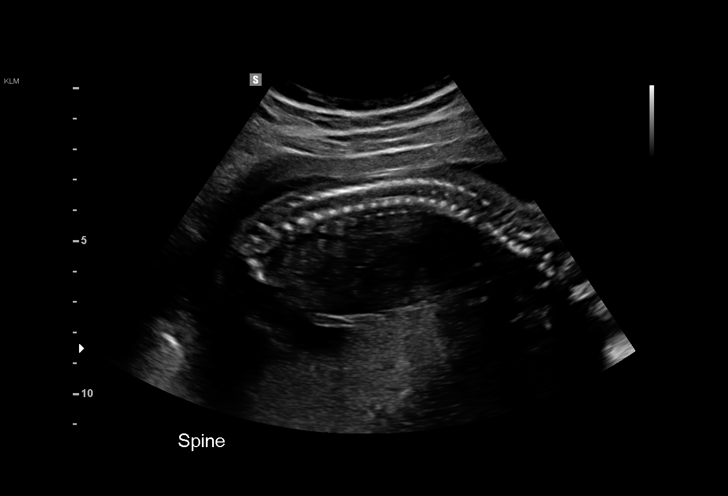
[im 88/92]
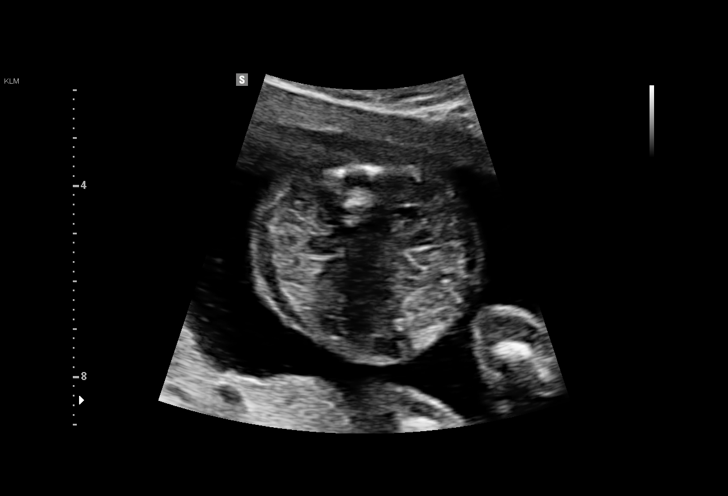

[13 of 28 positions shown; findings below may reference images not displayed]

FINDINGS: 1. Single intrauterine pregnancy.
2. Fetal biometry is consistent with dating.
3. Posterior placenta without evidence of previa.
4. Normal amniotic fluid volume.
5. Normal transabdominal cervical length.
6. The fetal heart is abnormal- the left ventricle and atrium
appear extremely small; the aorta appears small.
7. The outflow tracts and arches are not well visualized.
8. The remainder of the fetal anatomic survey is normal.
Recommendations

1. Appropriate fetal growth.
2. Congenital heart defect:
I discussed today's ultrasound findings are suspicious for
hypoplastic left heart syndrome.
I discussed that the next step is for referral to Pediatric
Cardiologists.
I discussed the counseling regarding treatment and
prognosis will depend on specific diagnosis determined by
their echocardiogram. I discussed she would likely need to
deliver at [REDACTED] or [facility] [HOSPITAL]
depending on final diagnosis to have immediate access to
the Pediatric Cardiologists. The infant would likely be
admitted to the NICU for care. I discussed the neonate will
likely need intervention after delivery including possible
intubation and surgery.

However, the Pediatric Cardiologist will discuss specific
therapies and prognosis with the patient.

I discussed the strong association with congenital heart
defects and fetal aneuploidy. The patient had a low risk quad
screen. The risk of aneuploidy or chromosomal defect may
be as high as 40%. I discussed congenital heart defects can
be seen in DiGeorge syndrome and many other genetic
conditions.

I discussed the benefits/risks of amniocentesis including risks
of bleeding, infection, rupture of membranes, and pregnancy
loss of 1/400. After counseling patient declined
amniocentesis. Also discussed option of cell free fetal DNA
and limitations in detecting fetal aneuploidy. I also offered the
patient a consultation with our genetic counselor which she
declined at this time.

I also discussed the increased risk of associated congenital
anomalies; although the remainder of the anatomy survey is
normal today. I discussed the limitations of ultrasound in
detecting anomalies.

I recommend the patient follow up in 4 weeks for a fetal
ultrasound. I recommend serial fetal growth ultrasounds.
Results given to Dr. [REDACTED].

## 2018-02-03 DIAGNOSIS — F4310 Post-traumatic stress disorder, unspecified: Secondary | ICD-10-CM | POA: Diagnosis not present

## 2018-04-15 DIAGNOSIS — M79602 Pain in left arm: Secondary | ICD-10-CM | POA: Diagnosis not present

## 2018-04-15 DIAGNOSIS — R079 Chest pain, unspecified: Secondary | ICD-10-CM | POA: Diagnosis not present

## 2019-04-23 DIAGNOSIS — F4310 Post-traumatic stress disorder, unspecified: Secondary | ICD-10-CM | POA: Diagnosis not present

## 2019-05-07 DIAGNOSIS — F4310 Post-traumatic stress disorder, unspecified: Secondary | ICD-10-CM | POA: Diagnosis not present

## 2019-05-19 DIAGNOSIS — F4310 Post-traumatic stress disorder, unspecified: Secondary | ICD-10-CM | POA: Diagnosis not present

## 2019-05-25 DIAGNOSIS — F4310 Post-traumatic stress disorder, unspecified: Secondary | ICD-10-CM | POA: Diagnosis not present

## 2019-06-02 DIAGNOSIS — F4310 Post-traumatic stress disorder, unspecified: Secondary | ICD-10-CM | POA: Diagnosis not present

## 2019-06-08 DIAGNOSIS — F4310 Post-traumatic stress disorder, unspecified: Secondary | ICD-10-CM | POA: Diagnosis not present

## 2019-06-16 DIAGNOSIS — F4310 Post-traumatic stress disorder, unspecified: Secondary | ICD-10-CM | POA: Diagnosis not present

## 2019-07-05 DIAGNOSIS — F4310 Post-traumatic stress disorder, unspecified: Secondary | ICD-10-CM | POA: Diagnosis not present

## 2019-07-19 DIAGNOSIS — F4310 Post-traumatic stress disorder, unspecified: Secondary | ICD-10-CM | POA: Diagnosis not present

## 2019-08-25 DIAGNOSIS — F4310 Post-traumatic stress disorder, unspecified: Secondary | ICD-10-CM | POA: Diagnosis not present

## 2019-09-22 DIAGNOSIS — F4310 Post-traumatic stress disorder, unspecified: Secondary | ICD-10-CM | POA: Diagnosis not present

## 2019-09-27 DIAGNOSIS — F4310 Post-traumatic stress disorder, unspecified: Secondary | ICD-10-CM | POA: Diagnosis not present

## 2019-10-12 DIAGNOSIS — F4310 Post-traumatic stress disorder, unspecified: Secondary | ICD-10-CM | POA: Diagnosis not present

## 2019-11-08 DIAGNOSIS — F4310 Post-traumatic stress disorder, unspecified: Secondary | ICD-10-CM | POA: Diagnosis not present

## 2019-12-03 DIAGNOSIS — Z13220 Encounter for screening for lipoid disorders: Secondary | ICD-10-CM | POA: Diagnosis not present

## 2019-12-03 DIAGNOSIS — Z0000 Encounter for general adult medical examination without abnormal findings: Secondary | ICD-10-CM | POA: Diagnosis not present

## 2019-12-15 DIAGNOSIS — F4310 Post-traumatic stress disorder, unspecified: Secondary | ICD-10-CM | POA: Diagnosis not present

## 2020-01-17 DIAGNOSIS — Z23 Encounter for immunization: Secondary | ICD-10-CM | POA: Diagnosis not present

## 2020-05-05 DIAGNOSIS — F4310 Post-traumatic stress disorder, unspecified: Secondary | ICD-10-CM | POA: Diagnosis not present

## 2020-05-12 DIAGNOSIS — F4310 Post-traumatic stress disorder, unspecified: Secondary | ICD-10-CM | POA: Diagnosis not present

## 2020-05-17 DIAGNOSIS — F4310 Post-traumatic stress disorder, unspecified: Secondary | ICD-10-CM | POA: Diagnosis not present

## 2020-05-22 DIAGNOSIS — F4310 Post-traumatic stress disorder, unspecified: Secondary | ICD-10-CM | POA: Diagnosis not present

## 2020-05-29 DIAGNOSIS — F4310 Post-traumatic stress disorder, unspecified: Secondary | ICD-10-CM | POA: Diagnosis not present

## 2020-06-05 DIAGNOSIS — F4310 Post-traumatic stress disorder, unspecified: Secondary | ICD-10-CM | POA: Diagnosis not present

## 2020-06-13 DIAGNOSIS — F4310 Post-traumatic stress disorder, unspecified: Secondary | ICD-10-CM | POA: Diagnosis not present

## 2020-06-19 DIAGNOSIS — F4310 Post-traumatic stress disorder, unspecified: Secondary | ICD-10-CM | POA: Diagnosis not present

## 2020-07-05 DIAGNOSIS — F4310 Post-traumatic stress disorder, unspecified: Secondary | ICD-10-CM | POA: Diagnosis not present

## 2020-07-10 DIAGNOSIS — F4310 Post-traumatic stress disorder, unspecified: Secondary | ICD-10-CM | POA: Diagnosis not present

## 2020-07-19 DIAGNOSIS — F4310 Post-traumatic stress disorder, unspecified: Secondary | ICD-10-CM | POA: Diagnosis not present

## 2020-07-26 DIAGNOSIS — F4310 Post-traumatic stress disorder, unspecified: Secondary | ICD-10-CM | POA: Diagnosis not present

## 2020-08-25 ENCOUNTER — Other Ambulatory Visit: Payer: Self-pay

## 2020-08-25 ENCOUNTER — Emergency Department (HOSPITAL_BASED_OUTPATIENT_CLINIC_OR_DEPARTMENT_OTHER): Payer: 59 | Admitting: Radiology

## 2020-08-25 ENCOUNTER — Encounter (HOSPITAL_BASED_OUTPATIENT_CLINIC_OR_DEPARTMENT_OTHER): Payer: Self-pay | Admitting: *Deleted

## 2020-08-25 ENCOUNTER — Emergency Department (HOSPITAL_BASED_OUTPATIENT_CLINIC_OR_DEPARTMENT_OTHER)
Admission: EM | Admit: 2020-08-25 | Discharge: 2020-08-25 | Disposition: A | Discharge status: Home / Self Care | Payer: 59 | Attending: Emergency Medicine | Admitting: Emergency Medicine

## 2020-08-25 DIAGNOSIS — R200 Anesthesia of skin: Secondary | ICD-10-CM | POA: Insufficient documentation

## 2020-08-25 DIAGNOSIS — R079 Chest pain, unspecified: Secondary | ICD-10-CM | POA: Diagnosis not present

## 2020-08-25 LAB — BASIC METABOLIC PANEL
Anion gap: 6 (ref 5–15)
BUN: 12 mg/dL (ref 6–20)
CO2: 27 mmol/L (ref 22–32)
Calcium: 9 mg/dL (ref 8.9–10.3)
Chloride: 104 mmol/L (ref 98–111)
Creatinine, Ser: 0.61 mg/dL (ref 0.44–1.00)
GFR, Estimated: >60 mL/min (ref >60)
Glucose, Bld: 85 mg/dL (ref 70–99)
Potassium: 4.1 mmol/L (ref 3.5–5.1)
Sodium: 137 mmol/L (ref 135–145)

## 2020-08-25 LAB — CBC
HCT: 37.5 % (ref 36.0–46.0)
Hemoglobin: 12.8 g/dL (ref 12.0–15.0)
MCH: 25.2 pg — ABNORMAL LOW (ref 26.0–34.0)
MCHC: 34.1 g/dL (ref 30.0–36.0)
MCV: 74 fL — ABNORMAL LOW (ref 80.0–100.0)
Platelets: 339 10*3/uL (ref 150–400)
RBC: 5.07 MIL/uL (ref 3.87–5.11)
RDW: 14.1 % (ref 11.5–15.5)
WBC: 5.6 10*3/uL (ref 4.0–10.5)
nRBC: 0 % (ref 0.0–0.2)

## 2020-08-25 LAB — D-DIMER, QUANTITATIVE: D-Dimer, Quant: 0.41 ug/mL-FEU (ref 0.00–0.50)

## 2020-08-25 LAB — TROPONIN I (HIGH SENSITIVITY): Troponin I (High Sensitivity): 3 ng/L (ref <18)

## 2020-08-25 NOTE — ED Triage Notes (Signed)
Woke up this morning with left chest pain and now, the pain is in her mid chest radiating to her right arm and right hand is tingling.

## 2020-08-25 NOTE — Discharge Instructions (Addendum)
Consider follow-up with cardiology and/or gastroenterology.

## 2020-08-25 NOTE — ED Provider Notes (Signed)
[facility] EMERGENCY DEPT Provider Note   CSN: [id number] Arrival date & time: 08/25/20  1040     History Chief Complaint  Norton presents with   Chest Pain    Monica Norton is a 35 y.o. female.  She has been having chest pain relatively constantly for over a month.  The pain is typically left-sided and will at times radiate to her left arm producing numbness in her hand.  She has had some feelings of pressure, and 1 time, while walking into the grocery store, she felt that she needed to quit walking because the pain was so severe.  Today, she awoke, and the pain was in her right chest.  This is a little bit different in the typical location of her pain.  It is also worse with deep breaths.  She does not feel short of breath, nauseated, or diaphoretic.     Chest Pain Associated symptoms: no abdominal pain, no back pain, no cough, no fever, no palpitations, no shortness of breath and no vomiting    HPI: A Monica Norton presents for evaluation of chest pain. Initial onset of pain was more than 6 hours ago. The Norton's chest pain is sharp and is not worse with exertion. The Norton's chest pain is not middle- or left-sided, is not well-localized, is not described as heaviness/pressure/tightness and does not radiate to the arms/jaw/neck. The Norton does not complain of nausea and denies diaphoresis. The Norton has no history of stroke, has no history of peripheral artery disease, has not smoked in the past 90 days, denies any history of treated diabetes, has no relevant family history of coronary artery disease (first degree relative at less than age 70), is not hypertensive, has no history of hypercholesterolemia and does not have an elevated BMI (>=30).   Past Medical History:  Diagnosis Date   Medical history non-contributory     There are no problems to display for this Norton.   Past Surgical History:  Procedure Laterality Date   NO PAST SURGERIES        OB History     Gravida  2   Para  1   Term      Preterm      AB      Living         SAB      IAB      Ectopic      Multiple      Live Births              History reviewed. No pertinent family history.  Social History   Tobacco Use   Smoking status: Never   Smokeless tobacco: Never  Vaping Use   Vaping Use: Never used  Substance Use Topics   Alcohol use: Yes    Comment: occasionally   Drug use: No    Home Medications Prior to Admission medications   Medication Sig Start Date End Date Taking? Authorizing Provider  levonorgestrel (MIRENA) 20 MCG/24HR IUD 1 each by Intrauterine route once.    [provider]    Allergies    Norton has no known allergies.  Review of Systems   Review of Systems  Constitutional:  Negative for chills and fever.  HENT:  Negative for ear pain and sore throat.   Eyes:  Negative for pain and visual disturbance.  Respiratory:  Negative for cough and shortness of breath.   Cardiovascular:  Positive for chest pain. Negative for palpitations.  Gastrointestinal:  Negative  for abdominal pain and vomiting.  Genitourinary:  Negative for dysuria and hematuria.  Musculoskeletal:  Negative for arthralgias and back pain.  Skin:  Negative for color change and rash.  Neurological:  Negative for seizures and syncope.  All other systems reviewed and are negative.  Physical Exam Updated Vital Signs BP 114/83 (BP Location: Right Arm)   Pulse (!) 56   Temp 98.4 F (36.9 C)   Resp 16   Ht 5\' 4"  (1.626 m)   Wt 78 kg   LMP  (LMP Unknown) Comment: IUD  SpO2 99%   BMI 29.52 kg/m   Physical Exam Vitals and nursing note reviewed.  Constitutional:      Appearance: She is well-developed.  HENT:     Head: Normocephalic and atraumatic.  Cardiovascular:     Rate and Rhythm: Normal rate and regular rhythm.     Heart sounds: Normal heart sounds.  Pulmonary:     Effort: Pulmonary effort is normal. No tachypnea.      Breath sounds: Normal breath sounds.  Musculoskeletal:     Right lower leg: No edema.     Left lower leg: No edema.  Skin:    General: Skin is warm and dry.  Neurological:     General: No focal deficit present.     Mental Status: She is alert and oriented to person, place, and time.  Psychiatric:        Mood and Affect: Mood normal.        Behavior: Behavior normal.    ED Results / Procedures / Treatments   Labs (all labs ordered are listed, but only abnormal results are displayed) Labs Reviewed  CBC - Abnormal; Notable for the following components:      Result Value   MCV 74.0 (*)    MCH 25.2 (*)    All other components within normal limits  BASIC METABOLIC PANEL  PREGNANCY, URINE  D-DIMER, QUANTITATIVE  TROPONIN I (HIGH SENSITIVITY)    EKG EKG Interpretation  Date/Time:  Friday August 25 2020 10:47:01 EDT Ventricular Rate:  65 PR Interval:  150 QRS Duration: 88 QT Interval:  426 QTC Calculation: 443 R Axis:   92 Text Interpretation: Normal sinus rhythm Rightward axis Nonspecific T wave abnormality Mild, diffuse T wave flattening and inversions No prior for comparison Abnormal ECG Confirmed by [id number] ([id number]) on 08/25/2020 11:13:49 AM  Radiology DG Chest 2 View  Result Date: 08/25/2020 CLINICAL DATA:  Left chest pain EXAM: CHEST - 2 VIEW COMPARISON:  None. FINDINGS: Cardiac and mediastinal contours are within normal limits. Lungs are clear. No pleural abnormalities. IMPRESSION: Lungs are clear. Electronically Signed   By: 08/27/2020 MD   On: 08/25/2020 11:43    Procedures Procedures   Medications Ordered in ED Medications - No data to display  ED Course  I have reviewed the triage vital signs and the nursing notes.  Pertinent labs & imaging results that were available during my care of the Norton were reviewed by me and considered in my medical decision making (see chart for details).    MDM Rules/Calculators/A&P HEAR Score: 1                          [name] presents with chest pain that has been ongoing for quite some time but was worse and different in character today.  She was evaluated for ACS, PE, pneumonia.  We considered musculoskeletal, gastrointestinal etiologies.  Unlikely myocarditis.  Pericarditis would still be in the differential but less likely than alternative pathologies.  She is suitable for discharge home. Final Clinical Impression(s) / ED Diagnoses Final diagnoses:  Chest pain, unspecified type    Rx / DC Orders ED Discharge Orders     None        Koleen Distance, MD 08/25/20 1236

## 2020-08-25 NOTE — ED Notes (Signed)
Pt dc home with husband, with belongings . Pt stated understanding of dc instructions

## 2020-08-30 ENCOUNTER — Other Ambulatory Visit (HOSPITAL_COMMUNITY): Payer: Self-pay

## 2020-08-30 DIAGNOSIS — R079 Chest pain, unspecified: Secondary | ICD-10-CM | POA: Diagnosis not present

## 2020-08-30 MED ORDER — ESOMEPRAZOLE MAGNESIUM 20 MG PO CPDR
20.0000 mg | DELAYED_RELEASE_CAPSULE | Freq: Every day | ORAL | 1 refills | Status: AC
  Filled 2020-08-30: qty 30, 30d supply, fill #0

## 2020-09-04 ENCOUNTER — Other Ambulatory Visit: Payer: Self-pay | Admitting: Physician Assistant

## 2020-09-04 DIAGNOSIS — R079 Chest pain, unspecified: Secondary | ICD-10-CM

## 2020-09-07 ENCOUNTER — Other Ambulatory Visit (HOSPITAL_COMMUNITY): Payer: Self-pay

## 2020-09-11 DIAGNOSIS — F4310 Post-traumatic stress disorder, unspecified: Secondary | ICD-10-CM | POA: Diagnosis not present

## 2020-09-13 ENCOUNTER — Other Ambulatory Visit: Payer: Self-pay

## 2020-09-13 ENCOUNTER — Ambulatory Visit
Admission: RE | Admit: 2020-09-13 | Discharge: 2020-09-13 | Disposition: A | Discharge status: Home / Self Care | Payer: 59 | Source: Ambulatory Visit | Attending: Physician Assistant | Admitting: Physician Assistant

## 2020-09-13 DIAGNOSIS — R918 Other nonspecific abnormal finding of lung field: Secondary | ICD-10-CM | POA: Diagnosis not present

## 2020-09-13 DIAGNOSIS — R079 Chest pain, unspecified: Secondary | ICD-10-CM | POA: Diagnosis not present

## 2020-09-13 DIAGNOSIS — I898 Other specified noninfective disorders of lymphatic vessels and lymph nodes: Secondary | ICD-10-CM | POA: Diagnosis not present

## 2020-09-13 MED ORDER — IOPAMIDOL (ISOVUE-300) INJECTION 61%
75.0000 mL | Freq: Once | INTRAVENOUS | Status: AC | PRN
  Administered 2020-09-13: 75 mL via INTRAVENOUS

## 2020-09-20 DIAGNOSIS — R079 Chest pain, unspecified: Secondary | ICD-10-CM | POA: Diagnosis not present

## 2020-09-28 DIAGNOSIS — F4310 Post-traumatic stress disorder, unspecified: Secondary | ICD-10-CM | POA: Diagnosis not present

## 2020-10-04 DIAGNOSIS — F4310 Post-traumatic stress disorder, unspecified: Secondary | ICD-10-CM | POA: Diagnosis not present

## 2020-10-10 DIAGNOSIS — F4310 Post-traumatic stress disorder, unspecified: Secondary | ICD-10-CM | POA: Diagnosis not present

## 2020-11-01 DIAGNOSIS — F4310 Post-traumatic stress disorder, unspecified: Secondary | ICD-10-CM | POA: Diagnosis not present

## 2020-12-04 DIAGNOSIS — E785 Hyperlipidemia, unspecified: Secondary | ICD-10-CM | POA: Diagnosis not present

## 2020-12-04 DIAGNOSIS — Z0000 Encounter for general adult medical examination without abnormal findings: Secondary | ICD-10-CM | POA: Diagnosis not present

## 2020-12-06 DIAGNOSIS — F4310 Post-traumatic stress disorder, unspecified: Secondary | ICD-10-CM | POA: Diagnosis not present

## 2020-12-25 DIAGNOSIS — F4310 Post-traumatic stress disorder, unspecified: Secondary | ICD-10-CM | POA: Diagnosis not present

## 2021-01-05 DIAGNOSIS — F4310 Post-traumatic stress disorder, unspecified: Secondary | ICD-10-CM | POA: Diagnosis not present

## 2021-01-12 DIAGNOSIS — F4310 Post-traumatic stress disorder, unspecified: Secondary | ICD-10-CM | POA: Diagnosis not present

## 2021-02-15 DIAGNOSIS — F4310 Post-traumatic stress disorder, unspecified: Secondary | ICD-10-CM | POA: Diagnosis not present

## 2021-03-09 DIAGNOSIS — F4310 Post-traumatic stress disorder, unspecified: Secondary | ICD-10-CM | POA: Diagnosis not present

## 2021-03-21 DIAGNOSIS — F4310 Post-traumatic stress disorder, unspecified: Secondary | ICD-10-CM | POA: Diagnosis not present

## 2021-04-10 DIAGNOSIS — F4310 Post-traumatic stress disorder, unspecified: Secondary | ICD-10-CM | POA: Diagnosis not present

## 2021-05-02 DIAGNOSIS — F4310 Post-traumatic stress disorder, unspecified: Secondary | ICD-10-CM | POA: Diagnosis not present

## 2021-05-16 DIAGNOSIS — F4310 Post-traumatic stress disorder, unspecified: Secondary | ICD-10-CM | POA: Diagnosis not present

## 2021-06-14 DIAGNOSIS — F4310 Post-traumatic stress disorder, unspecified: Secondary | ICD-10-CM | POA: Diagnosis not present

## 2021-06-29 DIAGNOSIS — F4310 Post-traumatic stress disorder, unspecified: Secondary | ICD-10-CM | POA: Diagnosis not present

## 2021-11-14 DIAGNOSIS — F4310 Post-traumatic stress disorder, unspecified: Secondary | ICD-10-CM | POA: Diagnosis not present

## 2021-12-12 ENCOUNTER — Other Ambulatory Visit (HOSPITAL_COMMUNITY): Payer: Self-pay

## 2021-12-12 DIAGNOSIS — R1310 Dysphagia, unspecified: Secondary | ICD-10-CM | POA: Diagnosis not present

## 2021-12-12 DIAGNOSIS — E785 Hyperlipidemia, unspecified: Secondary | ICD-10-CM | POA: Diagnosis not present

## 2021-12-12 DIAGNOSIS — Z0000 Encounter for general adult medical examination without abnormal findings: Secondary | ICD-10-CM | POA: Diagnosis not present

## 2021-12-12 MED ORDER — OMEPRAZOLE 20 MG PO CPDR
20.0000 mg | DELAYED_RELEASE_CAPSULE | Freq: Every morning | ORAL | 0 refills | Status: AC
  Filled 2021-12-12: qty 90, 90d supply, fill #0

## 2021-12-26 DIAGNOSIS — F4310 Post-traumatic stress disorder, unspecified: Secondary | ICD-10-CM | POA: Diagnosis not present

## 2022-02-26 DIAGNOSIS — F4310 Post-traumatic stress disorder, unspecified: Secondary | ICD-10-CM | POA: Diagnosis not present

## 2022-03-20 ENCOUNTER — Other Ambulatory Visit (HOSPITAL_COMMUNITY): Payer: Self-pay

## 2022-03-20 MED ORDER — CHLORHEXIDINE GLUCONATE 0.12 % MT SOLN
15.0000 mL | Freq: Two times a day (BID) | OROMUCOSAL | 0 refills | Status: AC
  Filled 2022-03-20: qty 473, 16d supply, fill #0

## 2022-03-21 DIAGNOSIS — F4310 Post-traumatic stress disorder, unspecified: Secondary | ICD-10-CM | POA: Diagnosis not present

## 2022-05-09 DIAGNOSIS — Z30432 Encounter for removal of intrauterine contraceptive device: Secondary | ICD-10-CM | POA: Diagnosis not present

## 2022-05-20 DIAGNOSIS — F4310 Post-traumatic stress disorder, unspecified: Secondary | ICD-10-CM | POA: Diagnosis not present

## 2022-07-01 ENCOUNTER — Other Ambulatory Visit: Payer: Self-pay | Admitting: Nurse Practitioner

## 2022-07-01 ENCOUNTER — Other Ambulatory Visit (HOSPITAL_COMMUNITY)
Admission: RE | Admit: 2022-07-01 | Discharge: 2022-07-01 | Disposition: A | Discharge status: Home / Self Care | Payer: 59 | Source: Ambulatory Visit | Attending: Nurse Practitioner | Admitting: Nurse Practitioner

## 2022-07-01 DIAGNOSIS — Z124 Encounter for screening for malignant neoplasm of cervix: Secondary | ICD-10-CM | POA: Insufficient documentation

## 2022-07-01 DIAGNOSIS — Z3169 Encounter for other general counseling and advice on procreation: Secondary | ICD-10-CM | POA: Diagnosis not present

## 2022-07-01 DIAGNOSIS — Z01419 Encounter for gynecological examination (general) (routine) without abnormal findings: Secondary | ICD-10-CM | POA: Diagnosis not present

## 2022-07-03 LAB — CYTOLOGY - PAP
Comment: NEGATIVE
Diagnosis: NEGATIVE
High risk HPV: NEGATIVE

## 2022-08-19 ENCOUNTER — Other Ambulatory Visit (HOSPITAL_COMMUNITY): Payer: Self-pay

## 2022-10-21 ENCOUNTER — Other Ambulatory Visit (HOSPITAL_COMMUNITY): Payer: Self-pay

## 2022-10-30 IMAGING — DX DG CHEST 2V
2 series · 2 of 2 positions shown · non-contrast
Comparison: None.

CLINICAL DATA: Left chest pain

EXAM:
CHEST - 2 VIEW

[chest pa]
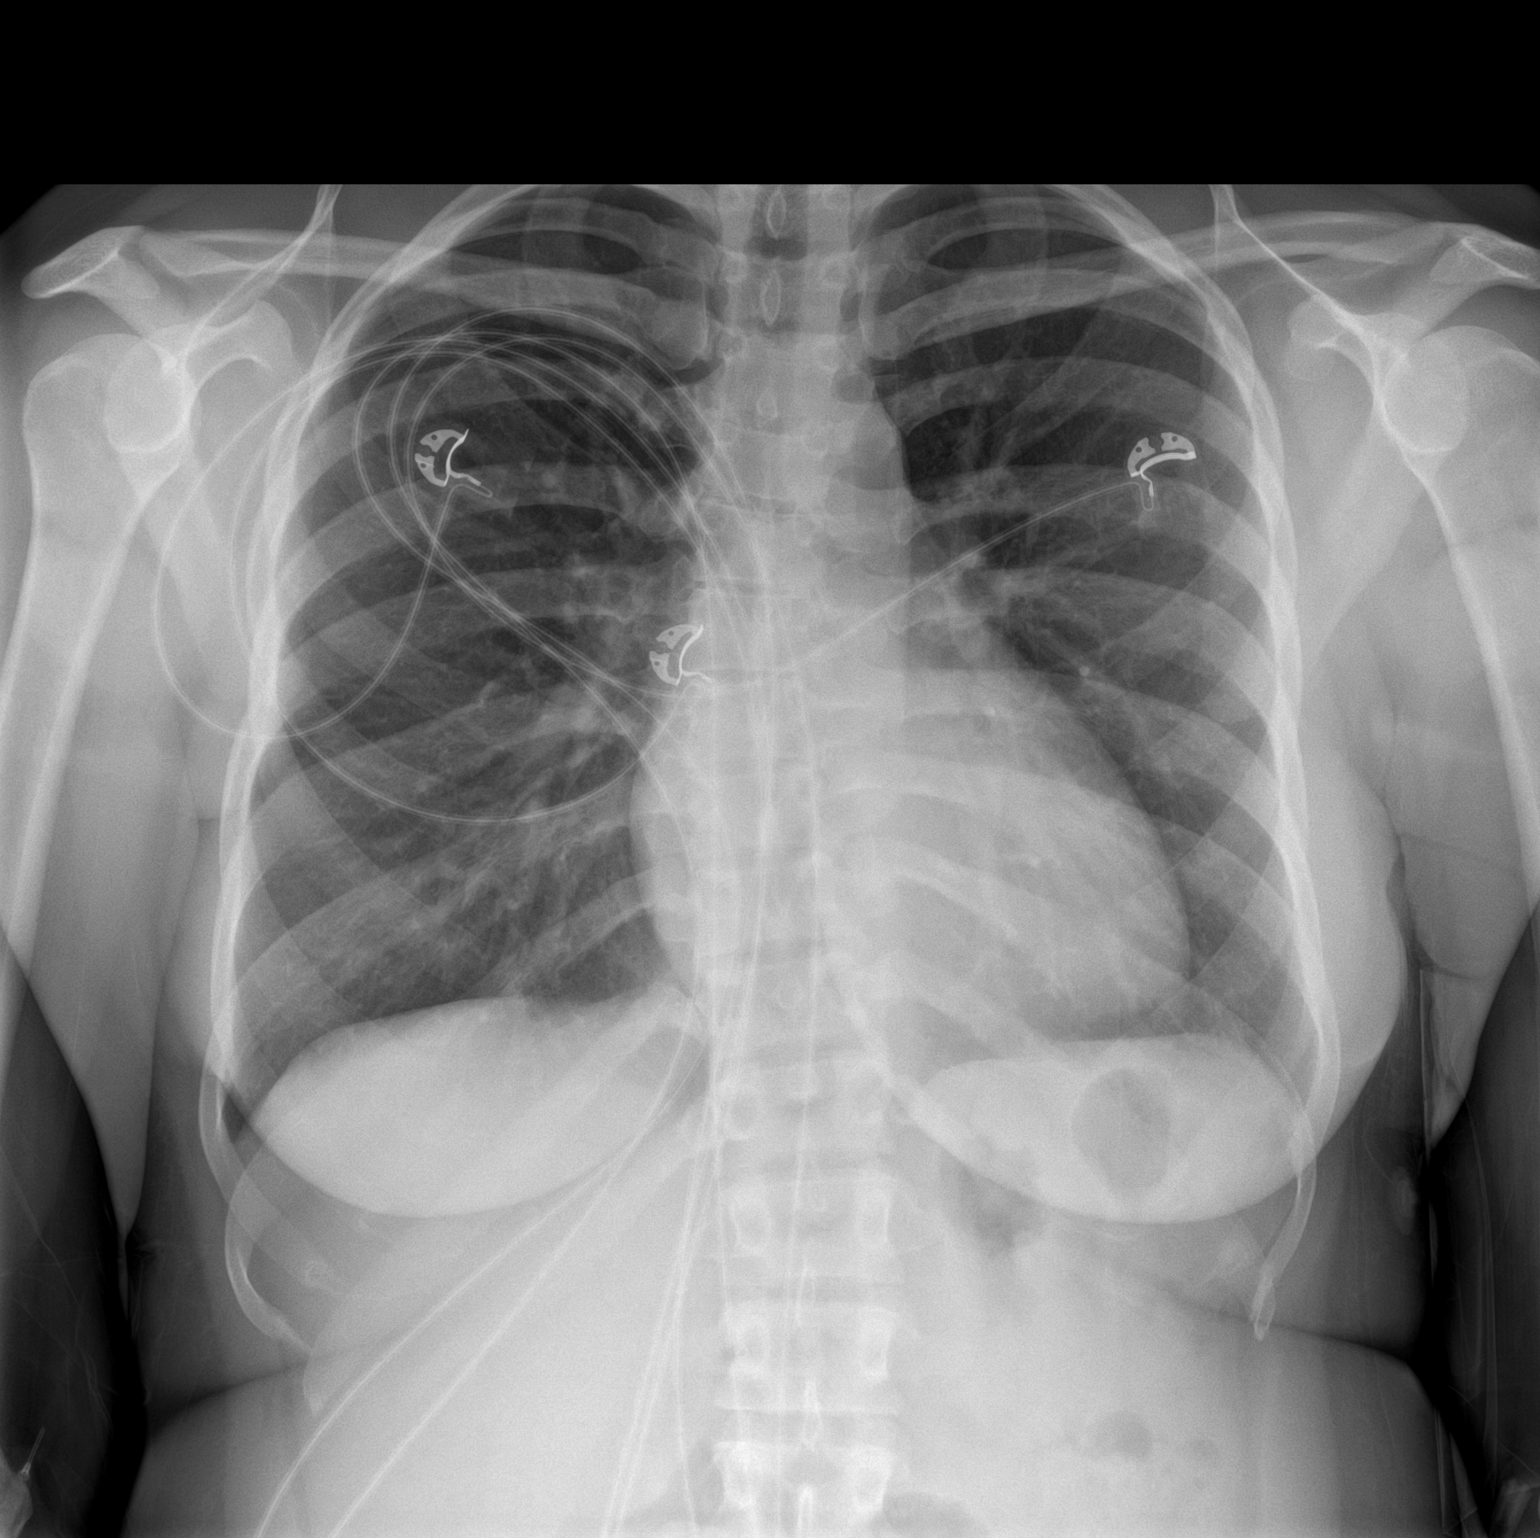

[chest lat]
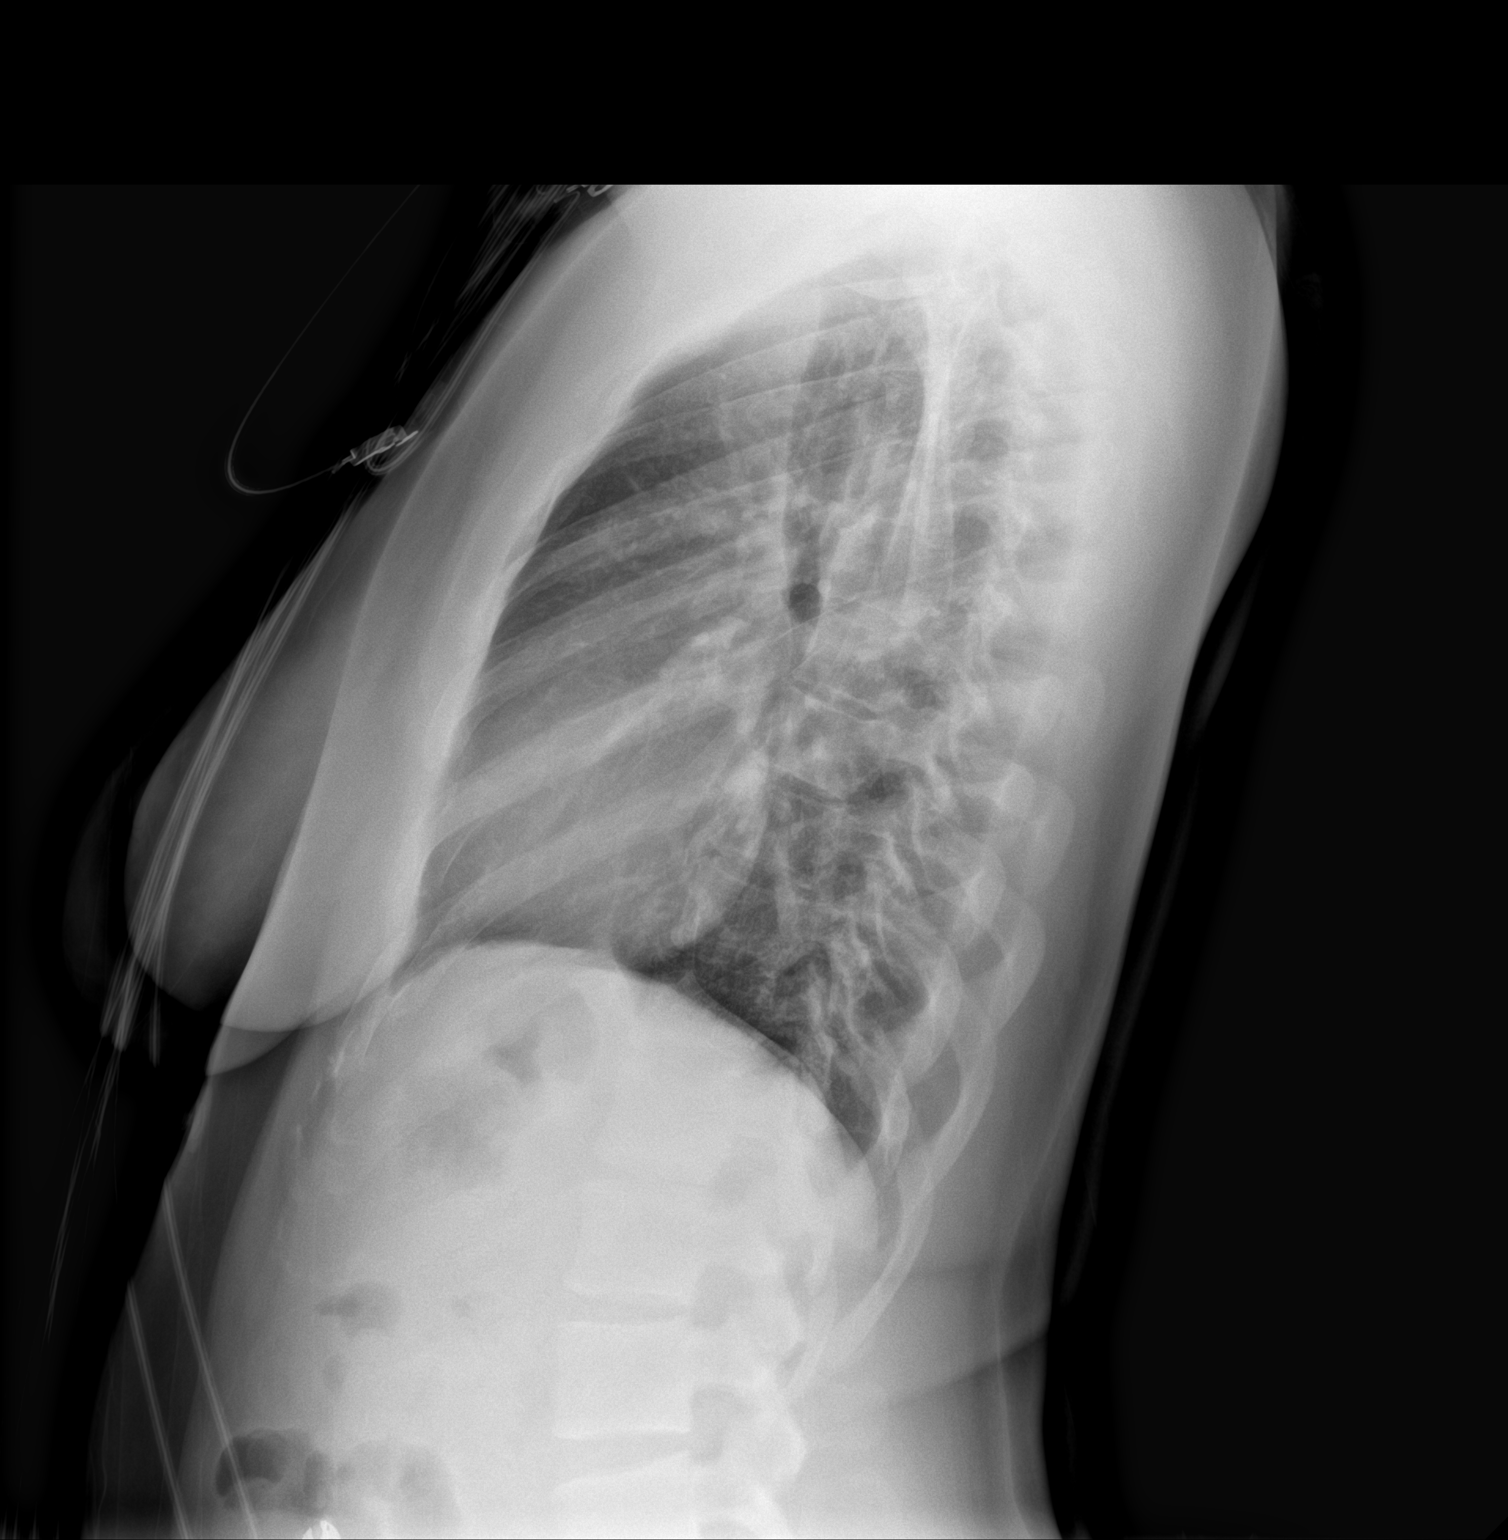

[2 of 2 positions shown; findings below may reference images not displayed]

FINDINGS: Cardiac and mediastinal contours are within normal limits. Lungs are
clear. No pleural abnormalities.
IMPRESSION: Lungs are clear.

## 2023-07-09 DIAGNOSIS — Z3201 Encounter for pregnancy test, result positive: Secondary | ICD-10-CM | POA: Diagnosis not present

## 2023-07-09 DIAGNOSIS — R079 Chest pain, unspecified: Secondary | ICD-10-CM | POA: Diagnosis not present

## 2023-07-09 DIAGNOSIS — Z01419 Encounter for gynecological examination (general) (routine) without abnormal findings: Secondary | ICD-10-CM | POA: Diagnosis not present

## 2023-07-09 DIAGNOSIS — Z789 Other specified health status: Secondary | ICD-10-CM | POA: Diagnosis not present

## 2023-07-09 DIAGNOSIS — N926 Irregular menstruation, unspecified: Secondary | ICD-10-CM | POA: Diagnosis not present

## 2023-07-11 ENCOUNTER — Encounter: Payer: Self-pay | Admitting: Nurse Practitioner

## 2023-07-16 ENCOUNTER — Other Ambulatory Visit: Payer: Self-pay | Admitting: Nurse Practitioner

## 2023-07-16 DIAGNOSIS — N644 Mastodynia: Secondary | ICD-10-CM

## 2023-07-24 ENCOUNTER — Encounter

## 2023-07-24 ENCOUNTER — Other Ambulatory Visit

## 2023-07-31 DIAGNOSIS — Z3481 Encounter for supervision of other normal pregnancy, first trimester: Secondary | ICD-10-CM | POA: Diagnosis not present

## 2023-07-31 DIAGNOSIS — Z3480 Encounter for supervision of other normal pregnancy, unspecified trimester: Secondary | ICD-10-CM | POA: Diagnosis not present

## 2023-08-12 DIAGNOSIS — O26851 Spotting complicating pregnancy, first trimester: Secondary | ICD-10-CM | POA: Diagnosis not present

## 2023-08-12 DIAGNOSIS — O09521 Supervision of elderly multigravida, first trimester: Secondary | ICD-10-CM | POA: Diagnosis not present

## 2023-08-14 DIAGNOSIS — O3680X0 Pregnancy with inconclusive fetal viability, not applicable or unspecified: Secondary | ICD-10-CM | POA: Diagnosis not present

## 2023-08-18 DIAGNOSIS — O3680X0 Pregnancy with inconclusive fetal viability, not applicable or unspecified: Secondary | ICD-10-CM | POA: Diagnosis not present

## 2023-08-22 DIAGNOSIS — O034 Incomplete spontaneous abortion without complication: Secondary | ICD-10-CM | POA: Diagnosis not present

## 2023-08-22 DIAGNOSIS — O3680X0 Pregnancy with inconclusive fetal viability, not applicable or unspecified: Secondary | ICD-10-CM | POA: Diagnosis not present

## 2023-08-22 DIAGNOSIS — O039 Complete or unspecified spontaneous abortion without complication: Secondary | ICD-10-CM | POA: Diagnosis not present

## 2023-08-23 ENCOUNTER — Encounter (HOSPITAL_COMMUNITY): Payer: Self-pay

## 2023-08-23 ENCOUNTER — Inpatient Hospital Stay (HOSPITAL_COMMUNITY)
Admission: AD | Admit: 2023-08-23 | Discharge: 2023-08-23 | Disposition: A | Discharge status: Home / Self Care | Attending: Obstetrics and Gynecology | Admitting: Obstetrics and Gynecology

## 2023-08-23 ENCOUNTER — Other Ambulatory Visit: Payer: Self-pay

## 2023-08-23 ENCOUNTER — Inpatient Hospital Stay (HOSPITAL_COMMUNITY)

## 2023-08-23 ENCOUNTER — Ambulatory Visit (HOSPITAL_COMMUNITY)

## 2023-08-23 DIAGNOSIS — Z3A00 Weeks of gestation of pregnancy not specified: Secondary | ICD-10-CM | POA: Diagnosis not present

## 2023-08-23 DIAGNOSIS — O09519 Supervision of elderly primigravida, unspecified trimester: Secondary | ICD-10-CM | POA: Diagnosis not present

## 2023-08-23 DIAGNOSIS — Z3A11 11 weeks gestation of pregnancy: Secondary | ICD-10-CM

## 2023-08-23 DIAGNOSIS — O029 Abnormal product of conception, unspecified: Secondary | ICD-10-CM | POA: Diagnosis not present

## 2023-08-23 DIAGNOSIS — O039 Complete or unspecified spontaneous abortion without complication: Secondary | ICD-10-CM

## 2023-08-23 DIAGNOSIS — O3680X0 Pregnancy with inconclusive fetal viability, not applicable or unspecified: Secondary | ICD-10-CM | POA: Diagnosis not present

## 2023-08-23 DIAGNOSIS — O021 Missed abortion: Secondary | ICD-10-CM | POA: Diagnosis not present

## 2023-08-23 DIAGNOSIS — O209 Hemorrhage in early pregnancy, unspecified: Secondary | ICD-10-CM | POA: Diagnosis not present

## 2023-08-23 LAB — CBC
HCT: 29.2 % — ABNORMAL LOW (ref 36.0–46.0)
Hemoglobin: 10.4 g/dL — ABNORMAL LOW (ref 12.0–15.0)
MCH: 25.7 pg — ABNORMAL LOW (ref 26.0–34.0)
MCHC: 35.6 g/dL (ref 30.0–36.0)
MCV: 72.3 fL — ABNORMAL LOW (ref 80.0–100.0)
Platelets: 391 10*3/uL (ref 150–400)
RBC: 4.04 MIL/uL (ref 3.87–5.11)
RDW: 14.5 % (ref 11.5–15.5)
WBC: 8.2 10*3/uL (ref 4.0–10.5)
nRBC: 0 % (ref 0.0–0.2)

## 2023-08-23 LAB — HCG, QUANTITATIVE, PREGNANCY: hCG, Beta Chain, Quant, S: 3071 m[IU]/mL — ABNORMAL HIGH (ref <5)

## 2023-08-23 MED ORDER — IBUPROFEN 800 MG PO TABS
800.0000 mg | ORAL_TABLET | Freq: Three times a day (TID) | ORAL | 0 refills | Status: AC
  Filled 2023-08-23: qty 30, 10d supply, fill #0

## 2023-08-23 MED ORDER — MISOPROSTOL 200 MCG PO TABS
400.0000 ug | ORAL_TABLET | Freq: Every day | ORAL | 0 refills | Status: DC
  Filled 2023-08-23 – 2023-09-10 (×2): qty 6, 3d supply, fill #0

## 2023-08-23 NOTE — MAU Provider Note (Signed)
 History     CSN: [id number]  Arrival date and time: 08/23/23 1519   Event Date/Time   First Provider Initiated Contact with Patient 08/23/23 1615      Chief Complaint  Patient presents with   Vaginal Bleeding   HPI  Monica Norton is a 38 y.o. G3P1 at [redacted]w[redacted]d who presents for evaluation of vaginal bleeding. Patient reports she was seen at [facility] on 07/15 and had an ultrasound that was not consistent with LMP. She followed up with HCGs and repeat u/s yesterday where she was diagnosed with missed miscarriage. She chose expectant management with plan to follow up on Monday for repeat u/s.   Today she started having heavy bleeding and cramping. She reports she was saturating pads before she could get up from the toilet. She reports she was also passing clots. Patient rates the pain as a 5/10 and has not tried anything for the pain.   OB History     Gravida  3   Para  1   Term      Preterm      AB      Living         SAB      IAB      Ectopic      Multiple      Live Births              Past Medical History:  Diagnosis Date   Medical history non-contributory     Past Surgical History:  Procedure Laterality Date   NO PAST SURGERIES      No family history on file.  Social History   Tobacco Use   Smoking status: Never   Smokeless tobacco: Never  Vaping Use   Vaping status: Never Used  Substance Use Topics   Alcohol use: Yes    Comment: occasionally   Drug use: No    Allergies: No Known Allergies  No medications prior to admission.    Review of Systems  Constitutional: Negative.  Negative for fatigue and fever.  HENT: Negative.    Respiratory: Negative.  Negative for shortness of breath.   Cardiovascular: Negative.  Negative for chest pain.  Gastrointestinal:  Positive for abdominal pain. Negative for constipation, diarrhea, nausea and vomiting.  Genitourinary:  Positive for vaginal bleeding. Negative for dysuria.  Neurological: Negative.   Negative for dizziness and headaches.   Physical Exam   Blood pressure 108/73, pulse 91, temperature 97.9 F (36.6 C), temperature source Oral, resp. rate 17, height 5' 4 (1.626 m), weight 86.1 kg, last menstrual period 06/02/2023, SpO2 100%, unknown if currently breastfeeding.  Patient Vitals for the past 24 hrs:  BP Temp Temp src Pulse Resp SpO2 Height Weight  08/23/23 1812 108/73 -- -- 91 -- -- -- --  08/23/23 1542 115/70 97.9 F (36.6 C) Oral 94 17 100 % 5' 4 (1.626 m) 86.1 kg    Physical Exam Vitals and nursing note reviewed.  Constitutional:      General: She is not in acute distress.    Appearance: She is well-developed.  HENT:     Head: Normocephalic.  Eyes:     Pupils: Pupils are equal, round, and reactive to light.  Cardiovascular:     Rate and Rhythm: Normal rate and regular rhythm.     Heart sounds: Normal heart sounds.  Pulmonary:     Effort: Pulmonary effort is normal. No respiratory distress.     Breath sounds: Normal breath sounds.  Abdominal:  General: Bowel sounds are normal. There is no distension.     Palpations: Abdomen is soft.     Tenderness: There is no abdominal tenderness.  Genitourinary:    Cervix: Cervical bleeding present.     Comments: POC at cervical os, gently removed with ring forceps Skin:    General: Skin is warm and dry.  Neurological:     Mental Status: She is alert and oriented to person, place, and time.  Psychiatric:        Mood and Affect: Mood normal.        Behavior: Behavior normal.        Thought Content: Thought content normal.        Judgment: Judgment normal.    MAU Course  Procedures  Results for orders placed or performed during the hospital encounter of 08/23/23 (from the past 24 hours)  CBC     Status: Abnormal   Collection Time: 08/23/23  3:54 PM  Result Value Ref Range   WBC 8.2 4.0 - 10.5 K/uL   RBC 4.04 3.87 - 5.11 MIL/uL   Hemoglobin 10.4 (L) 12.0 - 15.0 g/dL   HCT 70.7 (L) 63.9 - 53.9 %   MCV 72.3  (L) 80.0 - 100.0 fL   MCH 25.7 (L) 26.0 - 34.0 pg   MCHC 35.6 30.0 - 36.0 g/dL   RDW 85.4 88.4 - 84.4 %   Platelets 391 150 - 400 K/uL   nRBC 0.0 0.0 - 0.2 %  hCG, quantitative, pregnancy     Status: Abnormal   Collection Time: 08/23/23  3:54 PM  Result Value Ref Range   hCG, Beta Chain, Quant, S 3,071 (H) <5 mIU/mL    MDM Records from [facility] office reviewed Labs ordered and reviewed.   CBC, HCG Surg Path  POC gently removed from cervical os. Bleeding stopped and significant improvement of pain.  US  Transvaginal  CNM independently reviewed the imaging ordered. Imaging show some thickness of uterine lining but no remaining gestational sac noted.   Options for management reviewed. Patient feeling much better and would like to continue expectant management until her appointment in the office Monday but would like RX for misoprostol  in case she wants to use it for management. Patient and partner MDs and feel comfortable with management at home with close follow up on Monday.   Assessment and Plan   1. Miscarriage     -Discharge home in stable condition -Rx for misoprostol  and ibuprofen  sent to pharmacy.  -Vaginal bleeding and pain precautions discussed -Patient advised to follow-up with OB as scheduled for prenatal care -Patient may return to MAU as needed or if her condition were to change or worsen  Aleck CHRISTELLA Fireman, CNM 08/23/2023, 4:15 PM

## 2023-08-23 NOTE — MAU Note (Signed)
 Monica Norton is a 38 y.o. at [redacted]w[redacted]d here in MAU reporting: diagnosed yesterday by US  that her body was miscarrying and there was still products of conception in her uterus. States she has been bleeding on and off all week. Very first US  was July 15th which showed an irregular gestation sac. Went to [place] today and felt like she was wet so she went to the bathroom and saw the bleeding. States she left [place] to come here and has bleed through a tampon and maxi pad in 20-30 minutes. Has bled through her pants. Reporting many clots that are quarter sized. Reports minimal to no cramping today.  Pt crying in triage, states today is her husbands birthday and she feels like she has ruined his day and fun with friends in town.   LMP: 06/02/23 Onset of complaint: today  Pain score: 0 Vitals:   08/23/23 1542  BP: 115/70  Pulse: 94  Resp: 17  Temp: 97.9 F (36.6 C)  SpO2: 100%      Lab orders placed from triage:

## 2023-08-25 ENCOUNTER — Other Ambulatory Visit (HOSPITAL_COMMUNITY): Payer: Self-pay

## 2023-08-26 ENCOUNTER — Ambulatory Visit

## 2023-08-26 ENCOUNTER — Ambulatory Visit
Admission: RE | Admit: 2023-08-26 | Discharge: 2023-08-26 | Disposition: A | Discharge status: Home / Self Care | Source: Ambulatory Visit | Attending: Nurse Practitioner | Admitting: Nurse Practitioner

## 2023-08-26 ENCOUNTER — Ambulatory Visit: Admission: RE | Admit: 2023-08-26 | Source: Ambulatory Visit

## 2023-08-26 DIAGNOSIS — N644 Mastodynia: Secondary | ICD-10-CM | POA: Diagnosis not present

## 2023-08-26 DIAGNOSIS — R92343 Mammographic extreme density, bilateral breasts: Secondary | ICD-10-CM | POA: Diagnosis not present

## 2023-08-26 LAB — SURGICAL PATHOLOGY

## 2023-09-03 ENCOUNTER — Other Ambulatory Visit (HOSPITAL_COMMUNITY): Payer: Self-pay

## 2023-09-03 DIAGNOSIS — Z8759 Personal history of other complications of pregnancy, childbirth and the puerperium: Secondary | ICD-10-CM | POA: Diagnosis not present

## 2023-09-05 DIAGNOSIS — O039 Complete or unspecified spontaneous abortion without complication: Secondary | ICD-10-CM | POA: Diagnosis not present

## 2023-09-08 DIAGNOSIS — O034 Incomplete spontaneous abortion without complication: Secondary | ICD-10-CM | POA: Diagnosis not present

## 2023-09-08 DIAGNOSIS — O039 Complete or unspecified spontaneous abortion without complication: Secondary | ICD-10-CM | POA: Diagnosis not present

## 2023-09-10 ENCOUNTER — Other Ambulatory Visit (HOSPITAL_COMMUNITY): Payer: Self-pay

## 2023-09-17 ENCOUNTER — Other Ambulatory Visit (HOSPITAL_COMMUNITY): Payer: Self-pay

## 2023-09-17 DIAGNOSIS — O034 Incomplete spontaneous abortion without complication: Secondary | ICD-10-CM | POA: Diagnosis not present

## 2023-09-17 MED ORDER — MISOPROSTOL 200 MCG PO TABS
800.0000 ug | ORAL_TABLET | Freq: Once | ORAL | 0 refills | Status: AC
  Filled 2023-09-17: qty 4, 1d supply, fill #0

## 2023-10-10 ENCOUNTER — Other Ambulatory Visit (HOSPITAL_COMMUNITY): Payer: Self-pay

## 2023-10-10 DIAGNOSIS — B379 Candidiasis, unspecified: Secondary | ICD-10-CM | POA: Diagnosis not present

## 2023-10-10 DIAGNOSIS — Z3201 Encounter for pregnancy test, result positive: Secondary | ICD-10-CM | POA: Diagnosis not present

## 2023-10-10 DIAGNOSIS — N9489 Other specified conditions associated with female genital organs and menstrual cycle: Secondary | ICD-10-CM | POA: Diagnosis not present

## 2023-10-10 DIAGNOSIS — O034 Incomplete spontaneous abortion without complication: Secondary | ICD-10-CM | POA: Diagnosis not present

## 2023-10-10 MED ORDER — CLOTRIMAZOLE 1 % VA CREA
1.0000 | TOPICAL_CREAM | Freq: Every day | VAGINAL | 0 refills | Status: AC
  Filled 2023-10-10: qty 45, 7d supply, fill #0

## 2023-10-21 ENCOUNTER — Other Ambulatory Visit (HOSPITAL_COMMUNITY): Payer: Self-pay

## 2023-10-27 DIAGNOSIS — Z3200 Encounter for pregnancy test, result unknown: Secondary | ICD-10-CM | POA: Diagnosis not present
